# Patient Record
Sex: Female | Born: 1935 | Race: Black or African American | Hispanic: No | Smoking: Never smoker
Health system: Southern US, Community
[De-identification: ages and names within clinical notes are randomized; demographics above are authoritative.]

## PROBLEM LIST (undated history)

## (undated) ENCOUNTER — Emergency Department (HOSPITAL_COMMUNITY): Disposition: A | Discharge status: Home / Self Care | Payer: Medicare Other

## (undated) DIAGNOSIS — T4145XA Adverse effect of unspecified anesthetic, initial encounter: Secondary | ICD-10-CM

## (undated) DIAGNOSIS — T8859XA Other complications of anesthesia, initial encounter: Secondary | ICD-10-CM

## (undated) DIAGNOSIS — E119 Type 2 diabetes mellitus without complications: Secondary | ICD-10-CM

## (undated) DIAGNOSIS — I509 Heart failure, unspecified: Secondary | ICD-10-CM

## (undated) DIAGNOSIS — D649 Anemia, unspecified: Secondary | ICD-10-CM

## (undated) DIAGNOSIS — I499 Cardiac arrhythmia, unspecified: Secondary | ICD-10-CM

## (undated) DIAGNOSIS — I2510 Atherosclerotic heart disease of native coronary artery without angina pectoris: Secondary | ICD-10-CM

## (undated) DIAGNOSIS — E11621 Type 2 diabetes mellitus with foot ulcer: Secondary | ICD-10-CM

## (undated) DIAGNOSIS — I739 Peripheral vascular disease, unspecified: Secondary | ICD-10-CM

## (undated) DIAGNOSIS — M069 Rheumatoid arthritis, unspecified: Secondary | ICD-10-CM

## (undated) DIAGNOSIS — L97509 Non-pressure chronic ulcer of other part of unspecified foot with unspecified severity: Secondary | ICD-10-CM

## (undated) DIAGNOSIS — I10 Essential (primary) hypertension: Secondary | ICD-10-CM

## (undated) DIAGNOSIS — J189 Pneumonia, unspecified organism: Secondary | ICD-10-CM

## (undated) DIAGNOSIS — I639 Cerebral infarction, unspecified: Secondary | ICD-10-CM

## (undated) DIAGNOSIS — I219 Acute myocardial infarction, unspecified: Secondary | ICD-10-CM

## (undated) DIAGNOSIS — J45909 Unspecified asthma, uncomplicated: Secondary | ICD-10-CM

## (undated) HISTORY — PX: CHOLECYSTECTOMY OPEN: SUR202

## (undated) HISTORY — DX: Atherosclerotic heart disease of native coronary artery without angina pectoris: I25.10

## (undated) HISTORY — PX: CATARACT EXTRACTION: SUR2

## (undated) HISTORY — DX: Anemia, unspecified: D64.9

## (undated) HISTORY — PX: ABDOMINAL HYSTERECTOMY: SHX81

## (undated) HISTORY — DX: Rheumatoid arthritis, unspecified: M06.9

## (undated) HISTORY — DX: Cardiac arrhythmia, unspecified: I49.9

## (undated) HISTORY — DX: Essential (primary) hypertension: I10

## (undated) HISTORY — PX: APPENDECTOMY: SHX54

## (undated) HISTORY — DX: Acute myocardial infarction, unspecified: I21.9

## (undated) HISTORY — DX: Peripheral vascular disease, unspecified: I73.9

## (undated) HISTORY — DX: Cerebral infarction, unspecified: I63.9

## (undated) HISTORY — PX: TUBAL LIGATION: SHX77

---

## 2006-11-14 DIAGNOSIS — I639 Cerebral infarction, unspecified: Secondary | ICD-10-CM

## 2006-11-14 HISTORY — DX: Cerebral infarction, unspecified: I63.9

## 2007-11-29 ENCOUNTER — Ambulatory Visit: Payer: Self-pay | Admitting: *Deleted

## 2007-12-12 ENCOUNTER — Ambulatory Visit (HOSPITAL_COMMUNITY): Admission: RE | Admit: 2007-12-12 | Discharge: 2007-12-12 | Payer: Self-pay | Admitting: *Deleted

## 2007-12-12 ENCOUNTER — Ambulatory Visit: Payer: Self-pay | Admitting: *Deleted

## 2007-12-13 ENCOUNTER — Ambulatory Visit: Payer: Self-pay | Admitting: *Deleted

## 2007-12-26 ENCOUNTER — Inpatient Hospital Stay (HOSPITAL_COMMUNITY): Admission: RE | Admit: 2007-12-26 | Discharge: 2008-01-02 | Payer: Self-pay | Admitting: *Deleted

## 2007-12-27 ENCOUNTER — Encounter (INDEPENDENT_AMBULATORY_CARE_PROVIDER_SITE_OTHER): Payer: Self-pay | Admitting: *Deleted

## 2007-12-27 HISTORY — PX: PR VEIN BYPASS GRAFT,AORTO-FEM-POP: 35551

## 2008-01-01 ENCOUNTER — Ambulatory Visit: Payer: Self-pay | Admitting: Physical Medicine & Rehabilitation

## 2008-01-09 ENCOUNTER — Ambulatory Visit: Payer: Self-pay | Admitting: Vascular Surgery

## 2008-01-17 ENCOUNTER — Ambulatory Visit: Payer: Self-pay | Admitting: *Deleted

## 2008-01-28 ENCOUNTER — Encounter (HOSPITAL_BASED_OUTPATIENT_CLINIC_OR_DEPARTMENT_OTHER): Admission: RE | Admit: 2008-01-28 | Discharge: 2008-03-17 | Payer: Self-pay | Admitting: Surgery

## 2008-04-03 ENCOUNTER — Ambulatory Visit: Payer: Self-pay | Admitting: *Deleted

## 2008-07-10 ENCOUNTER — Ambulatory Visit: Payer: Self-pay | Admitting: *Deleted

## 2008-10-02 ENCOUNTER — Ambulatory Visit: Payer: Self-pay | Admitting: *Deleted

## 2009-01-01 ENCOUNTER — Ambulatory Visit: Payer: Self-pay | Admitting: *Deleted

## 2009-07-23 ENCOUNTER — Ambulatory Visit: Payer: Self-pay | Admitting: Vascular Surgery

## 2009-09-17 ENCOUNTER — Ambulatory Visit: Payer: Self-pay | Admitting: Vascular Surgery

## 2010-01-07 ENCOUNTER — Ambulatory Visit: Payer: Self-pay | Admitting: Vascular Surgery

## 2010-07-16 ENCOUNTER — Ambulatory Visit: Payer: Self-pay | Admitting: Vascular Surgery

## 2010-12-22 ENCOUNTER — Ambulatory Visit: Payer: Self-pay | Admitting: Vascular Surgery

## 2011-01-26 ENCOUNTER — Ambulatory Visit (INDEPENDENT_AMBULATORY_CARE_PROVIDER_SITE_OTHER): Payer: Medicare Other | Admitting: Vascular Surgery

## 2011-01-26 ENCOUNTER — Encounter (INDEPENDENT_AMBULATORY_CARE_PROVIDER_SITE_OTHER): Payer: Medicare Other

## 2011-01-26 DIAGNOSIS — I70219 Atherosclerosis of native arteries of extremities with intermittent claudication, unspecified extremity: Secondary | ICD-10-CM

## 2011-01-26 DIAGNOSIS — Z48812 Encounter for surgical aftercare following surgery on the circulatory system: Secondary | ICD-10-CM

## 2011-01-27 NOTE — Assessment & Plan Note (Signed)
OFFICE VISIT  [place], Damara DOB:  31-Jul-1928                                       01/26/2011 ZOXWR#:[id number]  I saw patient in the office today for continued followup of her peripheral vascular disease.  This is a pleasant 77 year old woman who had undergone a right femoral to anterior tibial artery bypass graft with a vein graft by Dr. Madilyn Fireman on 12/27/07.  She comes in for a routine follow-up visit.  She was last seen in our office in November 2010 by myself.  She denies any history of claudication, although her activity is fairly limited.  She has had no history of rest pain or nonhealing ulcers.  She does describe some cramping pain in her feet at night.  She has had some swelling in the right leg, which she has had since her surgery, and this has been stable.  This is aggravated by dependency and relieved somewhat with elevation.  SOCIAL HISTORY:  She is widowed.  She has 6 children.  She does not use tobacco.  REVIEW OF SYSTEMS:  CARDIOVASCULAR:  She has occasional chest pressure which has been stable.  She admits to dyspnea on exertion and has occasional palpitations. MUSCULOSKELETAL:  She does have a history of arthritis and joint pain.  PHYSICAL EXAMINATION:  This is a pleasant 82-year woman who appears her stated age.  Blood pressure is 123/73, heart rate is 96, saturation 97%. Lungs are clear bilaterally to auscultation without rales, rhonchi, or wheezing.  On cardiovascular exam, I do not detect any carotid bruits. She has a regular rate and rhythm.  She has palpable femoral pulses and a palpable right popliteal pulse and right dorsalis pedis pulse.  She also has a palpable left dorsalis pedis pulse, although slightly diminished.  Abdomen is soft and nontender with normal-pitched bowel sounds.  Musculoskeletal:  There are no major deformities or cyanosis. Neurologic:  She has no focal weakness or paresthesias.  I have independently  interpreted her arterial Doppler study, which shows an ABI at 94% on the right and 85% on the left.  These are stable.  She has a triphasic peroneal signal and a triphasic dorsalis pedis signal on the right with a monophasic posterior tibial signal.  On the left side, she has a biphasic peroneal signal, a triphasic dorsalis pedis signal, and a monophasic posterior tibial signal.  I reassured her that her bypass graft on the right is patent.  She has a palpable graft pulse and a palpable dorsalis pedis pulse.  I have ordered a follow-up study in 1 year.  I will plan on seeing her back at that time.  She knows to call sooner if she has problems.    Di Kindle. Edilia Bo, M.D. Electronically Signed  CSD/MEDQ  D:  01/26/2011  T:  01/27/2011  Job:  [id number]

## 2011-03-29 NOTE — Assessment & Plan Note (Signed)
OFFICE VISIT   [place], Brenisha  DOB:  01/09/1928                                       01/09/2008  EAVWU#:[id number]   SUMMARY:  The patient is status post right femoral to anterior tibial  bypass by Dr. Madilyn Fireman on February 12.  She presented to the office today  for removal of staples.  I was asked to evaluate her leg due to  swelling.  She apparently had significant swelling in her right lower  extremity just after operation but this has improved significantly.  On  exam today she still has a moderate amount of edema in the right calf.  The right foot also has some swelling around the ankle.  The daughter  and the patient both say that this has been resolving.  There is no  erythema.  There is no drainage from the incisions.  Several of the  staples were removed from the right groin and the right thigh today.  The staples in the calf were left in place due to the increased  swelling.  She will follow up next week to have the remainder of these  removed.  She had a 2+ dorsalis pedis pulse in her right foot.  Overall  she looks good.   Janetta Hora. Fields, MD  Electronically Signed   CEF/MEDQ  D:  01/09/2008  T:  01/11/2008  Job:  [id number]

## 2011-03-29 NOTE — Procedures (Signed)
BYPASS GRAFT EVALUATION   INDICATION:  Follow-up right lower extremity bypass graft; intermittent  numbness in right lower extremity.   HISTORY:  Diabetes:  Yes.  Cardiac:  No.  Hypertension:  Yes.  Smoking:  No.  Previous Surgery:  Right femoral-anterior tibial artery bypass graft  12/2007.   SINGLE LEVEL ARTERIAL EXAM                               RIGHT              LEFT  Brachial:                    168                160  Anterior tibial:             160                140  Posterior tibial:  Peroneal:                    145                142  Ankle/brachial index:        0.95               0.85   PREVIOUS ABI:  Date: 10/02/2008  RIGHT:  1.04  LEFT:  0.81   LOWER EXTREMITY BYPASS GRAFT DUPLEX EXAM:   DUPLEX:  1. Patent femoral-anterior tibial artery bypass graft with biphasic      waveforms proximal to, within and distal to it.  2. Slightly elevated velocities of 236 cm/s noted at the distal      anastomosis.   IMPRESSION:  1. Fairly stable ABIs bilaterally.  2. Patent right femoral-anterior tibial artery bypass graft with      slightly elevated velocities at the distal anastomosis.   ___________________________________________  P. Liliane Bade, M.D.   AS/MEDQ  D:  01/01/2009  T:  01/01/2009  Job:  [id number]

## 2011-03-29 NOTE — Procedures (Signed)
BYPASS GRAFT EVALUATION   INDICATION:  Follow-up evaluation of right lower extremity bypass graft.   HISTORY:  Diabetes:  Yes.  Cardiac:  No.  Hypertension:  Yes.  Smoking:  No.  Previous Surgery:  Right fem to anterior tibial artery bypass graft in  12/2007.   SINGLE LEVEL ARTERIAL EXAM                               RIGHT              LEFT  Brachial:                    170                170  Anterior tibial:             176                138  Posterior tibial:  Peroneal:                    158                100  Ankle/brachial index:        >1.0               0.81   PREVIOUS ABI:  Date: 07/10/08  RIGHT:  >1.0  LEFT:  0.94   LOWER EXTREMITY BYPASS GRAFT DUPLEX EXAM:   DUPLEX:  Doppler arterial waveforms are biphasic proximal to, within,  and distal to the bypass graft.   IMPRESSION:  1. Ankle brachial indices are stable from previous study.  2. Patent right femoral to anterior tibial artery bypass graft.   ___________________________________________  P. Bud Face, M.D.   MC/MEDQ  D:  10/02/2008  T:  10/02/2008  Job:  [id number]

## 2011-03-29 NOTE — Assessment & Plan Note (Signed)
OFFICE VISIT   [place], Tanishi  DOB:  [birth year]                                       09/17/2009  ZOXWR#:[id number]   I saw the patient in the office today for continued followup of her  peripheral vascular disease.  This is a patient who had been followed by  Dr. Madilyn Fireman.  She had poorly healing venous stasis ulcer of the right leg  and underwent a right femoral to below knee pop bypass with a vein  graft, this extended slightly on to the anterior tibial artery.  This  was in February of 2009.  On a recent followup study in September of  this year it was difficult to follow the graft behind the knee although  it appeared to be patent above and below this level.  She had an ABI of  100%.  She comes in for a routine followup visit.  She denies any  history of claudication, rest pain or nonhealing ulcers.  However, her  activities are fairly limited because of her age.   She does have a history of type 2 diabetes which is stable on her  current medications.  In addition, she has hypertension which is stable  on her current medications.   PAST MEDICAL HISTORY:  Is otherwise significant for arthritis.  She  denies any history of hypercholesterolemia, history of previous  myocardial infarction, history of congestive heart failure or history of  COPD.   PAST SURGICAL HISTORY:  She has had previous appendectomy.   FAMILY HISTORY:  She had a sister who had breast cancer.  She is unaware  of any history of premature cardiovascular disease.   SOCIAL HISTORY:  She is married.  She has six children.  She does not  use tobacco.  She does not use alcohol on a regular basis.   REVIEW OF SYSTEMS:  She does have a history of arthritis.  Otherwise  general, cardiac, pulmonary, GI, GU, vascular, neurologic, psychiatric,  ENT, hematologic and integument review of systems is unremarkable and is  documented on the medical history form in her chart.   PHYSICAL EXAMINATION:   General:  This is a pleasant 77 year old woman  who appears her stated age.  Vital signs:  Blood pressure is 135/78,  temperature is 98.0, heart rate is 101.  HEENT:  Pupils are equal, round  and reactive to light and accommodation.  Extraocular motions are  intact.  Conjunctivae are normal.  Neck:  Supple.  There is no JVD.  There is no cervical lymphadenopathy.  Lungs:  Clear bilaterally to  auscultation without rales, rhonchi or wheezing.  Cardiovascular:  I do  not appreciate any carotid bruits.  She has a regular rate and rhythm  without murmur or gallop appreciated.  Vascular:  She has mild edema of  the right leg where she has had vein harvested.  She has palpable radial  femoral pulses bilaterally.  She has a palpable right popliteal and  right dorsalis pedis pulse.  I cannot palpate pulses in the left foot  although the left foot is warm and well-perfused.  Abdomen:  Soft,  nontender with normal pitched bowel sounds.  No masses are appreciated.  Musculoskeletal:  There are no major deformities or cyanosis.  Neurologic:  Is nonfocal with no weakness or paresthesias noted.  Skin:  There are no ulcers or  rashes.   I did independently interpret her Doppler study which shows monophasic  Doppler signals in both feet but an ABI of 100% on the right and 91% on  the left.  Her bypass graft appears to be patent except for one area  which cannot be well visualized.  However, she has good velocities  throughout with no areas of significantly increased velocity except for  some mildly elevated velocities at the distal anastomosis.   I think her graft is patent based on her Doppler study.  I have ordered  a followup study in 3 months and I will see her back at that time.  I  have encouraged her to stay as active as possible.  Fortunately she is  not a smoker.  I certainly would not recommend any aggressive workup to  work up her graft given that she is asymptomatic and has a palpable   dorsalis pedis pulse.  I will see her back in 3 months.   IDENTIFICATION:   [name] [name]. [name] [name], M.D.  Electronically Signed   CSD/MEDQ  D:  09/17/2009  T:  09/18/2009  Job:  [id number]

## 2011-03-29 NOTE — Procedures (Signed)
BYPASS GRAFT EVALUATION   INDICATION:  Follow up right femoral-to-anterior tibial bypass graft.   HISTORY:  Diabetes:  Yes.  Cardiac:  No.  Hypertension:  Yes.  Smoking:  No.  Previous Surgery:  Right femoral to anterior tibial bypass graft in  February, 2009.   SINGLE LEVEL ARTERIAL EXAM                               RIGHT              LEFT  Brachial:                    153                134  Anterior tibial:             148                111  Posterior tibial:            128                106  Peroneal:  Ankle/brachial index:        0.97               0.73   PREVIOUS ABI:  Date: 07/24/09  RIGHT:  1.03  LEFT:  0.91   LOWER EXTREMITY BYPASS GRAFT DUPLEX EXAM:   DUPLEX:  Biphasic waveforms noted proximally within and distally  throughout the right femoral to anterior tibial bypass graft.  Unable to  duplicate elevated velocities from the previous study.   IMPRESSION:  1. Stable right ankle brachial index.  2. Decreased left ankle brachial index.  There is evidence to suggest      moderate arterial disease.  3. Patent right femoral to anterior tibial bypass graft.         ___________________________________________  Di Kindle. Edilia Bo, M.D.   CB/MEDQ  D:  01/07/2010  T:  01/07/2010  Job:  [id number]

## 2011-03-29 NOTE — Assessment & Plan Note (Signed)
[facility]   NAME:  [name], [name]               ACCOUNT NO.:  [id number]   MEDICAL RECORD NO.:  [id number]      DATE OF BIRTH:  22-Dec-1927   PHYSICIAN:  Theresia Majors. Tanda Rockers, M.D. VISIT DATE:  02/14/2008                                   OFFICE VISIT   SUBJECTIVE:  [name] is a 77 year old lady who we are seeing for  management of a stasis ulcer.  In the interim, we have treated her with  triamcinolone ointment and a Profore light.  She continues to be  minimally ambulatory.  She is living with her daughter.  There has been  no fever or excessive swelling.  There has been no drainage.   OBJECTIVE:  VITAL SIGNS:  Blood pressure is 161/79, respirations 16,  pulse rate 90, temperature 98, capillary blood glucose is 44.  Inspection of the right lower extremity shows a persistence of 3+ edema  at the pedal level extending to the distal third of the leg.  There is  hyperpigmentation.  There is a palpable pulse; however, the ulcers on  the right lateral and the right posterior area have a thin well-adherent  yellowish exudate.  The wounds are nontender.  There is minimum  erythema.  No evidence of abscess tracking.   ASSESSMENT:  Clinical response to Profore Light.   PLAN:  We will return the patient to compression using the Profore Light  and triamcinolone ointment.  We have given her a prescription for  bilateral 20-30 mm compression hose with open toes.  She will procure  these garments and will be reevaluated in 1 week.  We anticipate that at  that time she will be able to make a transition into the below-the-knee  garment.      Harold A. Tanda Rockers, M.D.  Electronically Signed     HAN/MEDQ  D:  02/14/2008  T:  02/14/2008  Job:  [id number]

## 2011-03-29 NOTE — Procedures (Signed)
VASCULAR LAB EXAM   INDICATION:  Right greater saphenous vein mapping prior to right fem-tib  bypass graft.  History of bilateral claudication.   HISTORY:  Diabetes:  Yes.  Cardiac:  No.  Hypertension:  Yes.   EXAM:  Reverse greater saphenous vein mapping.   IMPRESSION:  There are two major branches of the right greater saphenous  vein.  The more posterior and medial branch is the more substantial of  the two with a diameter ranging from 0.3 to 0.6 cm from the groin to the  ankle.   ___________________________________________  P. Liliane Bade, M.D.   MC/MEDQ  D:  12/13/2007  T:  12/14/2007  Job:  [id number]

## 2011-03-29 NOTE — Procedures (Signed)
BYPASS GRAFT EVALUATION   INDICATION:  Status post right fem to anterior tibial artery bypass  graft.   HISTORY:  Diabetes:  Yes.  Cardiac:  No.  Hypertension:  Yes.  Smoking:  No.  Previous Surgery:  Right fem to anterior tibial bypass graft in February  of 2009 by Dr. Madilyn Fireman.   SINGLE LEVEL ARTERIAL EXAM                               RIGHT              LEFT  Brachial:                    170                169  Anterior tibial:             171                145  Posterior tibial:            150                96  Peroneal:  Ankle/brachial index:        1.0                0.85   PREVIOUS ABI:  Date:  RIGHT:  LEFT:   LOWER EXTREMITY BYPASS GRAFT DUPLEX EXAM:   DUPLEX:  Patent right lower extremity bypass graft with no increase in  Doppler velocities noted.   IMPRESSION:  1. Patent right fem to anterior tibial artery bypass graft noted.  2. Normal right ankle brachial index with a mildly decreased left ABI      noted.   ___________________________________________  P. Liliane Bade, M.D.   CH/MEDQ  D:  04/03/2008  T:  04/03/2008  Job:  [id number]

## 2011-03-29 NOTE — Consult Note (Signed)
VASCULAR SURGERY CONSULTATION   [place], Asheton  DOB:  01/11/1936                                       11/29/2007  DGUYQ#:[id number]   CHIEF COMPLAINT:  Right leg pain with nonhealing ulceration.   HISTORY OF PRESENT ILLNESS:  The patient is a 77 year old female with a  history of diabetes and hypertension who presents at this time with a 4-  6 week history of right leg pain.  This has been associated with an  ulcer on the right lateral aspect of her calf for approximately 3 weeks.  She feels that her feet are cold.  She has numbness in her right foot.  She notes increased swelling of her right leg.  She has pain in her  right leg at night which awakens her.  She is able to get up and walk or  put her foot over the side of the bed for relief of pain.  The pain is  brought on by walking with some right calf claudication type symptoms,  relieved by rest.   PAST MEDICAL HISTORY:  1. Type 2 diabetes.  2. Hypertension.  3. Rheumatoid arthritis.   FAMILY HISTORY:  Mother died in her 84's with the complications of  childbirth.  Father died in his 78's with a history of diabetes and  heart disease.  Two siblings are deceased, a brother at 57 and a sister  in her 43's, both of cancer.   SOCIAL HISTORY:  The patient is widowed with 6 children.  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  She does not smoke or use alcohol products.  Never used tobacco products significantly.   REVIEW OF SYSTEMS:  This was reviewed today per the patient encounter  form.  The patient notes only pain in her legs, a nonhealing ulcer,  arthritic joint discomfort and muscle pain.  Denies general, cardiac,  pulmonary, GI, GU, neurologic, psychiatric, ENT or hematologic problems.   MEDICATIONS:  1. Glipizide 10 mg daily.  2. Darvocet 1-2 tablets q.4 h p.r.n.  3. Amoxicillin 500 mg daily.  4. Lasix 40 mg daily.  5. Lanoxin 0.25 mg daily.  6. Enalapril 5 mg daily.   ALLERGIES:  PENICILLIN  causes a rash and MYCIN antibiotics also cause a  rash.   PHYSICAL EXAMINATION:  General:  A 77 year old [place] female.  She is alert and oriented.  In no acute distress.  Vital signs:  BP  139/76, pulse 110 per minute, respirations 18 per minute, O2 sat 99%.  HEENT:  Mouth and throat are clear.  Normocephalic.  Atraumatic.  Extraocular movements intact.  Neck:  Supple.  No thyromegaly or  adenopathy.  Cardiovascular:  Normal at bedtime without murmurs.  Regular rate and rhythm.  No rubs or gallops.  Respiratory:  Equal entry  bilaterally.  No rales or rhonchi.  Abdomen:  Soft and nontender.  No  masses or organomegaly.  Normal bowel sounds without bruits.  Extremities:  Lower extremity reveal intact femoral pulses bilaterally.  No popliteal, posterior tibial or dorsalis pedis pulses are palpable.  The right leg reveals mild chronic venous insufficiency changes with 2+  edema, superficial varicosities and mild pigmentation.  There is a 2 cm  diameter lateral malleolar ulcer just superior to the malleolus in the  right leg.  Neurological:  The patient is alert and  oriented.  Gait is  normal.  Cranial nerves intact.  Strength equal bilaterally.  2+  reflexes.  Skin:  Warm and dry.  Chronic skin changes noted in the right  leg with ulceration as noted above.   INVESTIGATIONS:  MR angiogram carried out in [place] reveals  occlusion of the mid right superficial femoral artery with  reconstitution of the popliteal artery and 1 vessel runoff to the ankle.  Ankle brachial indices are 0.20 on the right and 0.86 on the left.  Digital pressures were measured as zero on the right and 0.47 on the  left.   IMPRESSION:  1. Right lower extremity peripheral vascular disease with ulceration.  2. Chronic venous insufficiency right lower extremity with ulceration.  3. Type 2 diabetes.  4. Hypertension.  5. Rheumatoid arthritis.   MEDICAL DECISION MAKING:  The patient has a nonhealing  ulcer on her  right lower extremity related to peripheral vascular disease and chronic  venous insufficiency.  Healing of this will be dependent on first  establishing adequate inflow into the right lower leg.  This hopefully  can be carried out by percutaneous technique.  The patient is scheduled  to undergo lower extremity arteriogram with possible intervention  12/12/2007 at [facility].  She will begin aspirin 325 mg daily  at this time and Plavix 75 mg daily 4 days prior to the procedure.   Other chronic medical conditions including rheumatoid arthritis, type 2  diabetes and hypertension appear well controlled at this time.   Balinda Quails, M.D.  Electronically Signed  PGH/MEDQ  D:  11/29/2007  T:  11/30/2007  Job:  [id number]   cc:   [place]

## 2011-03-29 NOTE — Assessment & Plan Note (Signed)
OFFICE VISIT   [place], Chrissie  DOB:  [birth year]                                       12/13/2007  ZOXWR#:[id number]   The patient is seen in the office today with her daughters to discuss  the results of her arteriogram yesterday.  This reveals extensive  peripheral vascular disease of the right lower extremity.  The right  superficial femoral artery was totally occluded.  There was also severe  disease of the right popliteal artery with single vessel anterior tibial  runoff.   The patient has a non-healing venous stasis ulcer of her right leg and  right ABI of 0.20.   I feel the only option at this time for healing of this ulcer and  reducing the risk of potential limb loss is right femoral anterior  tibial bypass.   A duplex scan was carried out today.  This reveals a patent, good-sized  right saphenous vein.   Surgery is therefore scheduled for 11/25/2007 at [facility].  We discussed the potential risks of this procedure.  She has no history  of heart disease, although there is a risk of myocardial event.  Also a  risk of stroke.  Also a risk of potential graft failure and potential  limb loss.   Balinda Quails, M.D.  Electronically Signed   PGH/MEDQ  D:  12/13/2007  T:  12/14/2007  Job:  [id number]

## 2011-03-29 NOTE — Assessment & Plan Note (Signed)
OFFICE VISIT   [place], Jewelianna  DOB:  Apr 21, 1928                                       01/17/2008  ZOXWR#:[id number]   The patient underwent right femoral anterior tibial bypass with  saphenous vein graft 12/27/2007 at [facility].  She has been  staying at [facility] for convalescence.  Planned  discharge from that facility this weekend.   No major complaints other than leg swelling.   Incisions are healing well.  2+ dorsalis pedis pulse on the right.  She  still has a right lateral calf venous stasis ulcer.   BP 150/79, pulse 103 per minute.   The patient is doing quite well following her surgery.  I will plan to  follow up with her per routine protocol.  She will be referred back to  Dr. Tanda Rockers at the [facility] for further management  of her leg ulcers.  The leg is not fully revascularized.   Balinda Quails, M.D.  Electronically Signed   PGH/MEDQ  D:  01/17/2008  T:  01/18/2008  Job:  [id number]   cc:   Jake Shark A. Tanda Rockers, M.D.  Sanjuana Letters, MD

## 2011-03-29 NOTE — Assessment & Plan Note (Signed)
[facility]   NAME:  [name] [name], [name] [name]               ACCOUNT NO.:  [id number]   MEDICAL RECORD NO.:  [id number]      DATE OF BIRTH:  09-Jul-1928   PHYSICIAN:  Theresia Majors. Tanda Rockers, M.D. VISIT DATE:  02/07/2008                                   OFFICE VISIT   SUBJECTIVE:  [name] is a 77 year old lady who we are seeing in  followup for a stasis ulcer.  The patient has had revascularization and  was initially evaluated with some postoperative edema, which  contraindicated placement of a standard compression wrap.  Since that  time, she has been treated with topical Iodosorb every 2 to 3 days  without compression.  She continues to keep the leg elevated.  There has  been no interim fever, pain or drainage.  She is accompanied by her  daughter.   OBJECTIVE:  Blood pressure 131/64, respirations 18, pulse rate 96,  temperature is 99.9.  Capillary blood glucose is 164 mg percent.  Inspection of the right lower extremity shows that there is impacted  Iodosorb in the previous stasis ulcers.  The foot is warm.  There is a  palpable dorsalis pedis pulse.  There is a persistence of 2+ edema, but  this is a marked improvement compared to her last exam.  There is no  evidence of active infection.  The surgical incisions are well  approximated.   ASSESSMENT:  Clinical response to revascularization and topical  Iodosorb.   PLAN:  We will apply mild compression in the form of a Profore Lite and  reevaluate the patient in 1 week.      Harold A. Tanda Rockers, M.D.  Electronically Signed     HAN/MEDQ  D:  02/07/2008  T:  02/07/2008  Job:  [id number]

## 2011-03-29 NOTE — Op Note (Signed)
NAME:  [name], [name]               ACCOUNT NO.:  [id number]   MEDICAL RECORD NO.:  [id number]          PATIENT TYPE:  AMB   LOCATION:  SDS                          FACILITY:  [facility]   PHYSICIAN:  Balinda Quails, M.D.    DATE OF BIRTH:  May 04, 1928   DATE OF PROCEDURE:  12/12/2007  DATE OF DISCHARGE:                               OPERATIVE REPORT   PREOPERATIVE DIAGNOSIS:  Nonhealing ulcer, right lower extremity.   POSTOPERATIVE DIAGNOSIS:  Nonhealing ulcer, right lower extremity.   PROCEDURE:  1. Abdominal aortogram.  2. Bilateral lower extremity runoff arteriography.   ACCESS:  Left common femoral artery 5 French sheath.   SELECTIVE CATHETERIZATIONS:  Second order right external iliac artery.   CONTRAST:  110 mL Visipaque.   COMPLICATIONS:  None apparent.   CLINICAL NOTE:  [name] [name] is a 77 year old female with a history of  chronic venous insufficiency.  She now has an ulcer on her right lower  extremity which has not healed with conservative treatment.  Workup for  this revealed marked reduction in right ankle brachial index of 0.20.  She is brought to the cath lab at this time for diagnostic workup with  arteriography and possible intervention.   PROCEDURE NOTE:  The patient was brought to the cath lab in stable  condition.  She was placed in a supine position.  Both groins were  prepped and draped in a sterile fashion.  She was administered 25 mcg  femoral intravenously.  The skin and subcutaneous tissue of the left  groin was instilled with 1% Xylocaine.  An 18 gauge needle was  introduced in the left common femoral artery.  A 0.035 Wholey guidewire  was advanced through the needle into the mid abdominal aorta.  The  needle was removed, a 5 [place] sheath advanced over the guidewire, the  dilator, and the sheath flushed with heparin saline solution.  A  standard pigtail catheter was advanced over the guidewire to the mid  abdominal aorta.  A standard AP mid abdominal  aortogram was obtained.  This revealed single widely patent bilateral renal arteries.  The  superior mesenteric artery filled briskly.  The inferior mesenteric  artery was also patent.  The infrarenal aorta was widely patent without  significant plaque or narrowing.  The common iliac arteries bilaterally  were normal in caliber.  The hypogastric and external iliac arteries  were patent bilaterally.   The pigtail catheter was then brought down to the aortic bifurcation.  The pigtail was hooked on the bifurcation and an angled Glidewire was  then advanced across the bifurcation into the right external iliac  artery.  The pigtail catheter was removed and an endhole catheter was  advanced into the right external iliac artery.  A selective right lower  extremity arteriogram was obtained.  This revealed the right external  iliac artery to be widely patent.  The right common femoral artery was  also widely patent.  The right profunda femoris artery was intact.  The  right superficial femoral artery revealed an occlusion in the mid thigh.  Extensive profunda and superficial  femoral collaterals were then present  which reconstituted a severely diseased popliteal artery at the level of  the knee joint.  A small popliteal artery then gave rise to a patent  right anterior tibial artery which provided flow to the foot.  The right  peroneal and posterior tibial arteries were occluded in the proximal and  mid calf.  There was reconstitution of the right posterior tibial artery  at the level of the malleolus of the right foot via collaterals.   The endhole catheter was then removed.  A retrograde injection was then  made through the left femoral sheath to obtain a left lower extremity  arteriogram.  This revealed the left external and common femoral  arteries to be widely patent.  The left profunda femoris artery was  intact.  The left superficial femoral artery was patent.  There was  disease at the  proximal portion of the left superficial femoral artery  with a tubular stenosis estimated to be 50%.  Several areas of  irregularity were noted throughout the length of the left superficial  femoral artery with areas of approximately 50% stenosis.  There was  continuous flow, however, to the level of the left popliteal artery  which was patent although there was disease noted just proximal to the  knee joint.  The left popliteal artery then gave rise to a large  anterior tibial artery which provided continuous flow to the foot.  At  the mid calf level, there was a moderate to severe stenosis of the left  anterior tibial artery.  The posterior tibial and peroneal arteries in  the left calf were occluded proximally with some reconstitution noted  distally.   This completed the arteriogram procedure.  The left femoral sheath was  removed without apparent complications.  The patient was transferred to  the holding area in stable condition.   FINAL IMPRESSION:  1. Normal renal arteries bilaterally.  2. Intact aortoiliac segment.  3. Right superficial femoral artery occlusion with severely diseased      tibial runoff in the right calf via anterior tibial artery.  4. Left lower extremity reveals patent runoff, although several areas      of stenosis were noted in the superficial femoral, popliteal, and      dominant left anterior tibial runoff vessel.   DISPOSITION:  These results will be reviewed further with the patient  and family, the patient is not a candidate for endovascular repair or  recanalization due to severe disease and it is recommended consideration  be given to right lower extremity revascularization with bypass surgery.      Balinda Quails, M.D.  Electronically Signed     PGH/MEDQ  D:  12/12/2007  T:  12/12/2007  Job:  [id number]   cc:   [place]

## 2011-03-29 NOTE — Procedures (Signed)
BYPASS GRAFT EVALUATION   INDICATION:  Followup right lower extremity bypass graft.   HISTORY:  Diabetes:  Yes.  Cardiac:  No.  Hypertension:  Yes.  Smoking:  No.  Previous Surgery:  Right femoral to anterior tibial artery bypass graft  in 12/2007.   SINGLE LEVEL ARTERIAL EXAM                               RIGHT              LEFT  Brachial:                    156                150  Anterior tibial:             160                146  Posterior tibial:            152                Not audible  Peroneal:                                       136  Ankle/brachial index:        1.02               0.94   PREVIOUS ABI:  Date:  04/03/2008  RIGHT:  1.0  LEFT:  0.85   LOWER EXTREMITY BYPASS GRAFT DUPLEX EXAM:   DUPLEX:  Biphasic Doppler waveforms noted throughout the right lower  extremity bypass graft and its native vessels with no increase in  velocities.   IMPRESSION:  1. Patent right femoral to anterior tibial artery bypass graft with no      evidence of stenosis noted.  2. Stable bilateral ankle brachial indices.   ___________________________________________  P. Liliane Bade, M.D.   CH/MEDQ  D:  07/10/2008  T:  07/10/2008  Job:  [id number]

## 2011-03-29 NOTE — Consult Note (Signed)
NAME:  [name] [name], [name] [name]               ACCOUNT NO.:  [id number]   MEDICAL RECORD NO.:  [id number]          PATIENT TYPE:  REC   LOCATION:  FOOT                         FACILITY:  [facility]   PHYSICIAN:  Theresia Majors. Tanda Rockers, M.D.DATE OF BIRTH:  [birth year]   DATE OF CONSULTATION:  01/29/2008  DATE OF DISCHARGE:                                 CONSULTATION   SUBJECTIVE:  [name] [name] is a 77 year old lady referred by Dr. Liliane Bade  for evaluation and management of an ulceration on the lateral malleolus  of the right lower extremity.   IMPRESSION:  Stasis ulceration.   RECOMMENDATIONS:  We will proceed with topical application of Iodosorb  gel and judicious use of mild to moderate compression in consideration  of the patient's recent revascularization of the right lower extremity.   SUBJECTIVE:  [name] [name] is a 77 year old lady who was evaluated by Dr.  Madilyn Fireman approximately five weeks ago for increasing rest pain in her right  lower extremity.  She subsequently underwent arteriography and  eventually a vein bypass graft to the tibial vessels of the right lower  extremity.  Synchronously with the symptoms of critical arterial  ischemia, the patient had developed an ulceration on the lateral  malleolus.  That wound persists until this time.  Following her surgery,  there has been improvement in the ulceration, but the swelling has been  persistent.  There has been no drainage.  There is been no fever.  The  patient is ambulatory.  She has had the usual postoperative swelling but  that has markedly decreased as of date.   PAST MEDICAL HISTORY:  Remarkable for drug allergies to PENICILLIN and  MYCIN DRUGS, both of which cause hives swelling and respiratory  embarrassment.   CURRENT MEDICATION LIST:  1. Aspirin 325 mg daily.  2. Enalapril 5 mg daily.  3. Glipizide 10 mg t.i.d.  4. Lanoxin 0.25 mg daily.  5. Lasix 40 mg daily.  6. Hydrocodone/APAP 5/500 q.4h. p.r.n. for pain.  7. Ultram 50 mg  one to two every 6 hours p.r.n.   PAST SURGICAL HISTORY:  1. Most recently her right femoral-anterior tibial bypass per Dr.      Madilyn Fireman.  2. She has had laser eye surgery.  3. Appendectomy.  4. Hysterectomy.  5. Complexed drainage of the frontal sinuses requiring cranioplasty in      her late teens and early 40s.   FAMILY HISTORY:  Positive for diabetes and cancer.   SOCIAL HISTORY:  She is married.  She has six adult children, four live  locally, two remotely.  She is a retired Engineer, civil (consulting).   REVIEW OF SYSTEMS:  She has never smoked.  She denies any chronic cough.  Her appetite is good and her weight is stable.  She denies visual  changes, specifically symptoms consistent with TIAs, transient  paralysis.  She denies chest pain or tachycardia.  Bowel and bladder  function are unremarkable.  Her weight has been stable.  She denies  arthralgias, heat or cold intolerance, polydipsia or polyuria.  She has  been a diabetic for several years.  She  is controlled with diet and  avoidance of stress.   PHYSICAL EXAMINATION:  GENERAL APPEARANCE:  She is an alert, oriented  female in no acute distress.  She is accompanied by her daughter.  VITAL SIGNS:  Blood pressure is 146/68, respirations 16, pulse of 90,  temperature 97.7, capillary blood glucose is 160 mg percent.  HEENT:  Exam is remarkable for postsurgical changes.  NECK:  Supple.  Trachea is midline.  Thyroid is nonpalpable.  LUNGS:  Clear.  HEART:  Heart sounds are normal.  ABDOMEN:  Soft without masses.  EXTREMITIES:  Extremity exam is remarkable for the recent surgical  changes in the right lower extremity.  There is a 3+ edema and resolving  postsurgical ecchymosis.  The surgical incisions are well approximated  with no evidence of infection.  There is a readily palpable dorsalis  pedis pulse on the right lower extremity as well as the left.  There is  a flat ulceration over the right lateral malleolus.  There is no  drainage.  There  is no evidence of ascending infection or excessive  tenderness.  The left lower extremity is essentially normal.  There is  mild hypesthesia in the right lower extremity consistent with her  postsurgical state.  In addition, there is loss of protective sensation  over the hallux in the second digit bilaterally.   DISCUSSION:  [name] [name] is five weeks post successful femoral-tibial  bypass with commensurate postsurgical changes.  She continues to be  ambulatory.  She has a persistent stasis ulcer over the right lateral  malleolus.  We will hesitate to add compression therapy for the  management of the stasis ulcer at this time, but we have shown her  daughter the technique of applying topical Iodosorb and a fish net and a  slightly compressive bandage.  We have instructed the patient in  elevation of the legs and the avoidance of dependent edema.  We will  reevaluate her in one week.  If there is any evidence of increase in  this ulceration, we will reconsider the addition of mild to moderate  compression using a Profore Lite.  We have explained the diagnosis and  this treatment plan to the daughter and the patient in terms that they  seem to understand.  They express gratitude for having been seen in the  clinic and indicate that they will be compliant.      Harold A. Tanda Rockers, M.D.  Electronically Signed     HAN/MEDQ  D:  01/29/2008  T:  01/29/2008  Job:  [id number]   cc:   Georgann Housekeeper, MD

## 2011-03-29 NOTE — Op Note (Signed)
NAME:  [name], Dayona               ACCOUNT NO.:  [id number]   MEDICAL RECORD NO.:  [id number]          PATIENT TYPE:  INP   LOCATION:  2007                         FACILITY:  [facility]   PHYSICIAN:  Balinda Quails, M.D.    DATE OF BIRTH:  21-Aug-1928   DATE OF PROCEDURE:  12/27/2007  DATE OF DISCHARGE:                               OPERATIVE REPORT   SURGEON:  Balinda Quails, MD.   ASSISTANT:  Wilmon Arms, PA.   ANESTHETIC:  General endotracheal.   PREOPERATIVE DIAGNOSIS:  Ischemic right lower extremity.   POSTOPERATIVE DIAGNOSIS:  Ischemic right lower extremity.   PROCEDURE:  Right femoral-anterior tibial bypass with reversed  autogenous saphenous vein.   CLINICAL NOTE:  [name] [name] is a 77 year old diabetic female referred  by Dr. Tanda Rockers from the wound care center with a poorly-healing venous  stasis ulcer of the right lower extremity.  Workup did reveal advanced  peripheral vascular disease.  Arteriography revealed extensive disease  of the right superficial femoral artery and proximal popliteal artery.  There was reconstitution of the distal popliteal artery with dominant  anterior tibial runoff.   The patient is brought to the operating at this time for a limb salvage  right lower extremity revascularization.   OPERATIVE PROCEDURE:  The patient brought to the operating room in  stable hemodynamic condition.  Placed under general endotracheal  anesthesia.  The right leg was prepped and draped in a sterile fashion.   Oblique skin incision made in the right groin.  Dissection carried down  through subcutaneous tissue with electrocautery.  Deep dissection  carried down to expose the right saphenofemoral junction.  The saphenous  vein was exposed and tributaries ligated with 3-0 silk and divided.  Distal dissection carried down along the saphenous vein, ligating  tributaries with 3-0 silk.  Separated longitudinal skin incisions made  throughout the course of the right  thigh,  the greater saphenous vein  exposed, isolated, tributaries ligated with 3-0 silk and divided.  The  vein was exposed down to the proximal calf through a longitudinal skin  incision.  Mobilized distally.  The vein appeared to be of good quality  with some mild varicose changes.   Through the right groin incision the right common femoral artery was  exposed down to the inguinal ligament and encircled with a vessel loop.  Distal dissection carried down to the origin of the profunda and  superficial femoral arteries, which were freed and encircled with vessel  loops.   The right popliteal artery was then exposed through the medial approach.  The gastrocnemius fascia taken down.  The distal right popliteal artery  freed.  This was moderately diseased.  The right anterior tibial artery  origin was then dissected out.  This was a soft vessel and good-caliber.  The tibioperoneal trunk was encircled with a vessel loop.   The vein was then harvested, ligating it proximally and distally with 2-  0 silk and divided.  The vein cannulated in the reversed fashion and  dilated with heparin saline solution.  It was fairly uniform in size and  of adequate quality for bypass.   A subsartorial tunnel was then created from the popliteal fossa between  the heads of the gastrocnemius, muscles tunneled to the groin.  The  patient then administered 7000 units of heparin intravenously.   The right common femoral, profunda and superficial femoral arteries were  all controlled with clamps.  A longitudinal arteriotomy made in the  distal right common femoral artery.  The reversed vein was beveled and  anastomosed end-to-side to the right common femoral artery using running  6-0 Prolene suture.  The proximal anastomosis completed, the artery  flushed and excellent flow present through the vein.  The proximal  anastomosis was hemostatic.  The vein then brought through the  subsartorial tunnel down to the  exposed distal popliteal and anterior  tibial artery.  The popliteal artery controlled proximally with a  bulldog clamp.  The tibial vessels controlled with loops.  A  longitudinal arteriotomy made in the distal popliteal artery, extended  out onto the origin of the right anterior tibial artery.  The vein was  measured to appropriate length with the leg straightened, divided and  anastomosed end-to-side to the right popliteal and the anterior tibial  arteries using running 6-0 Prolene suture.  At completion of the distal  anastomosis the vessels all flushed, the vein flushed, clamps were  removed, excellent flow present.   Adequate hemostasis was obtained.  Sponge and instrument counts were  correct.  The vein harvest bed was drained with a 15 round Blake drain,  exited inferiorly through the skin, fixed to the skin with 2-0 silk  suture.   The vein harvest incisions were then closed with running 2-0 Vicryl  suture in a single subcutaneous layer and staples applied to the skin.  The groin incision closed with running 2-0 Vicryl suture in two  subcutaneous layers and staples applied to skin.   The right popliteal fossa closed with interrupted 2-0 Vicryl suture in a  deep fascial layer and running 2-0 Vicryl suture in the subcutaneous  layer.  Staples applied to skin.  Sterile dressings applied.   The patient tolerated the procedure well.  There were no apparent  complications.  The patient was transferred to the recovery room in  stable condition.      Balinda Quails, M.D.  Electronically Signed     PGH/MEDQ  D:  12/27/2007  T:  12/28/2007  Job:  [id number]   cc:   Jake Shark A. Tanda Rockers, M.D.

## 2011-03-29 NOTE — Procedures (Signed)
BYPASS GRAFT EVALUATION   INDICATION:  Postop right fem to anterior tibial artery bypass graft.   HISTORY:  Diabetes:  Yes.  Cardiac:  No.  Hypertension:  Yes.  Smoking:  No.  Previous Surgery:  Yes.   SINGLE LEVEL ARTERIAL EXAM                               RIGHT              LEFT  Brachial:                    122                127  Anterior tibial:             155                97  Posterior tibial:            106                Inaudible  Peroneal:  Ankle/brachial index:        1.22               0.76   PREVIOUS ABI:  Date:  01/07/2010  RIGHT:  0.97  LEFT:  0.73   LOWER EXTREMITY BYPASS GRAFT DUPLEX EXAM:   DUPLEX:  Biphasic waveforms noted proximally within and distally  throughout the right femoral to anterior tibial artery bypass graft.   IMPRESSION:  1. Stable right ankle brachial index.  2. Evidence of moderate left lower extremity arterial disease.  3. Patent right femoral to anterior tibial bypass graft.      ___________________________________________  Di Kindle. Edilia Bo, M.D.   EM/MEDQ  D:  07/16/2010  T:  07/16/2010  Job:  [id number]

## 2011-03-29 NOTE — Assessment & Plan Note (Signed)
[facility]   NAME:  [name] [name], [name] [name]               ACCOUNT NO.:  [id number]   MEDICAL RECORD NO.:  [id number]      DATE OF BIRTH:  06-29-1928   PHYSICIAN:  Theresia Majors. Tanda Rockers, M.D. VISIT DATE:  02/21/2008                                   OFFICE VISIT   SUBJECTIVE:  [name] is a 77 year old female who has undergone  revascularization of her right lower extremity, but had a persistent  stasis ulcer.  We treated her with compression wraps and topical  Iodosorb.  She returns for followup.  There has been no intermittent  drainage, malodor, pain, or fevers.  She continues to be ambulatory.   OBJECTIVE:  Blood pressure is 107/74, respiration is 18, pulse rate 81,  and temperature 97.6.  She is accompanied by her daughter.  Inspection  of the right lower extremity shows that the stasis ulcers numbered 1 and  2 have completely resolved.  There is 100% granulation with no evidence  of infection.  There is a persistence of 1+ edema.  Capillary refill is  brisk.   ASSESSMENT:  Resolved stasis ulcer.   PLAN:  We are discharging the patient from active management in the  Wound Center.  She has procured and has been adequately fitted for below  the knee 30-40 mm open toe compression hose.  She is discharged.  Continue her follow up per her primary care physician.      Harold A. Tanda Rockers, M.D.  Electronically Signed     HAN/MEDQ  D:  02/21/2008  T:  02/22/2008  Job:  [id number]   cc:   Hinton Lovely, M.D.

## 2011-03-29 NOTE — Discharge Summary (Signed)
NAME:  [name] [name], [name] [name]               ACCOUNT NO.:  [id number]   MEDICAL RECORD NO.:  [id number]          PATIENT TYPE:  INP   LOCATION:  2007                         FACILITY:  [facility]   PHYSICIAN:  Balinda Quails, M.D.    DATE OF BIRTH:  June 21, 1928   DATE OF ADMISSION:  12/26/2007  DATE OF DISCHARGE:  01/02/2008                               DISCHARGE SUMMARY   ADMISSION DIAGNOSIS:  Ischemic right lower extremity.   DISCHARGE DIAGNOSES:  Ischemic right lower extremity.   SECONDARY DISCHARGE DIAGNOSES:  1. Type 2 diabetes.  2. Hypertension.  3. Rheumatoid arthritis.  4. Nonhealing ulcers in the right leg.   CONSULTATION:  Pharmacy on December 26, 2007.  Physical therapy on December 28, 2007.  Clinical social worker for short-term nursing facility placement on  January 01, 2008.  Occupational therapy on January 01, 2008.  Rehabilitation on December 31, 2007.   PROCEDURES:  Right femoral anterior tibial bypass with reversed  autogenous saphenous vein.   BRIEF HISTORY AND PHYSICAL:  [name] [name] is a 77 year old diabetic  female, referred by Dr. Tanda Rockers from the wound care center with a poorly-  healing venous stasis ulcer of the right lower extremity.  Workup did  reveal advanced peripheral vascular disease.  Arteriography revealed  extensive disease of the right superficial femoral artery and proximal  popliteal artery.  There was reconstruction of the distal popliteal  artery with anterior tibial run-off.  It was felt it was most  appropriate to have the patient brought to the operating room for limb  salvage, right lower extremity revascularization, on December 27, 2007.  After explaining risks and benefits, patient agreed to this procedure.   HOSPITAL COURSE:  Patient was admitted to [facility] on  December 26, 2007, to have procedure done by Dr. Madilyn Fireman for right femoral  anterior tibial bypass graft.  Patient was explained the risks and  benefits of this  procedure, agreed and signed consent for the procedure.  Patient was taken to the OR on the morning of December 26, 2007, to  undergo this procedure.  The patient tolerated the procedure with no  complications and remained stable throughout, was taken to the PACU,  where she continued to remain stable.  Patient was then later  transferred to the floor, where she continued to remain stable  throughout the night.  On postoperative day #1, patient was afebrile,  vital signs were stable.  Patient's incisions were clean and dry.  The  patient had a right palpable pulse at the dorsalis pedis.  ABIs were  completed and her right was at 0.63 and her left was at 1.02.   On postoperative day #2, patient continued to do well and had been  transferred to another floor.  She had no complaints and she had  ambulated to chair.  Dressings were removed.  Incisions were clean and  dry.  There was no hematoma.  There was some mild bruising.  The foot  was warm and pink and there was a 1+ palpable pulse at the dorsalis  pedis.  Patient was instructed to continue  to ambulate and would consult  PT if needed.  At this time, her Foley was discontinued and she was  instructed to continue to keep the right extremity elevated and  dressings dry in the groin.  At this time, we looked for possible plans  to discharge home on Monday.   Over the weekend, patient continued to remain afebrile and vitals were  stable.  Patient did complain, though, of increased pain in the right  leg.  We felt that maybe this was due to not taking enough pain  medications and encouraged her to take them more frequently.  Patient  had been alert and oriented and eating and also ambulating.  Patient was  very slow in progression with ambulation though.  The right leg  continued to be clean and dry on dressings intact at the groin.  She was  still having some mild edema with bruising of the left leg.  We  encouraged patient to continue to  elevate.  Foot was warm and patient  continued to have palpable pulses over the DP.  Also, there were not any  acute changes to the ulcers of the ankle and the heel on the right  extremity.   It was felt at this time the plan for discharging to home on Monday was  probably going to have to be extended a little bit due to the fact the  patient was progressing slowly with ambulating.   On Monday, postoperative day #5, the patient continued to remain stable  and afebrile.  Patient stated she was not feeling well, no vomiting, a  little bit of nausea, and in pain.  The right leg still was an issue for  patient as far as pain management, but she had not increased her pain  medicine frequency at all.  Patient's right leg on physical examination,  incisions were clean and dry and healing with no signs of infection.  There was probably about 3+ edema in the lower portion of the extremity.  There was a 2+ palpable pulse of the DP.  The right-leg ulcers were  improving after placing Silvadene and 4 X 4's on them.  Patient  continued to slowly progress with ambulation.  Also, Zofran for the  nausea.  If pain continued to increase and could not be managed, would  consider possibly changing oral medications.  The plans were to  discharge either to rehab or short-term nursing facility, depending on  if patient was a candidate for rehab, as patient lives in [place]  with not a lot of help.  Patient continued same without a lot of  progression with ambulation.  Patient was reviewed by rehab and was not  a candidate due to no other medical issues to be managed by a physician  at this time.  It was discussed with the patient and family about  placement and thought it was best that she go to a short-term nursing  facility to get some rehabilitation before going home.  Patient and  family agreed to this plan.  On January 02, 2008, the patient will be  discharged to nursing home facility for  short-term rehabilitation at  [facility].   PHYSICAL EXAMINATION:  Postoperative day #7:  PATIENT'S VITALS:  Blood pressure is 113/67, heart rate 90, respirations  18, temperature 98.2, oxygen 95 on room air.  There were no current  labs.  Upon entering the room, patient stated that she was improving with  ambulation and pain.  On physical examination, patient was alert and  oriented times three, in bed at the time.  The right leg, after removing  every other staple, it seemed to have helped decrease the edema.  The  patient's incisions were healing.  The foot was warm with a 3+ palpable  pulse at the dorsalis pedis.  Foot ulcers were continuing to improve.   ASSESSMENT AND PLAN:  At this time was to continue to increase  ambulation.  Continue dressing changes and care of the right extremity  wound to keep with dry dressings applied.  Also the right foot, continue  to apply Silvadene and 4 X 4's.  Patient will be discharged to short-  term nursing facility today, January 02, 2008.  Patient and family do  agree with this plan.   DISCHARGE MEDICATIONS:  1. Enalapril 5 mg two times daily.  2. Glipizide 10 mg three times daily with meals.  3. Lanoxin 0.25 daily.  4. Furosemide 40 mg daily as needed.  5. Hydrocodone/APAP 5/500 mg every 4-6 hours as needed for pain.  6. Aspirin 325 mg daily.   FOLLOWUP INSTRUCTIONS AND DISCHARGE CARE:  Discussed with patient  discharge instructions.  The patient stated that she understood and  agreed with these instructions.  Patient is planned to be discharged on  January 02, 2008 to short-term nursing facility.  Discussed with  patient she needs to follow a diabetic diet.  Keep wounds in the groin  dry.  Patient is to clean gently with soap and water for wounds.  Patient is to continue right-foot dressings with Silvadene and 4 X 4's.  Patient can increase activity as tolerated, walk with assistance.  Patient may shower.   Patient was instructed no lifting or driving for a  minimum of two weeks.  The patient was also instructed to contact office  or ER if increased redness, drainage, swelling, fever greater than 101,  pain in the lower leg or change in temperature of the extremity.  Patient was also instructed to follow up with Dr. Madilyn Fireman in three weeks  with ABIs and office will contact to schedule.  Patient is to continue  home medications per reconciliation sheet.  Patient also was given a  prescription for Ultram 50 mg one to two tablets every 6 hours as needed  for pain.   Again, patient stated that she agreed and understood these discharge  instructions.  Patient will discharge on January 02, 2008, as patient  is currently stable and afebrile.      Cyndy Freeze, PA      P. Liliane Bade, M.D.  Electronically Signed    ALW/MEDQ  D:  01/02/2008  T:  01/02/2008  Job:  [id number]

## 2011-04-01 NOTE — Procedures (Signed)
BYPASS GRAFT EVALUATION   INDICATION:  Right lower extremity bypass graft.   HISTORY:  Diabetes:  Yes.  Cardiac:  No.  Hypertension:  Yes.  Smoking:  No.  Previous Surgery:  Right femoral to anterior tibial artery bypass graft  on February 2009.   SINGLE LEVEL ARTERIAL EXAM                               RIGHT              LEFT  Brachial:                    172                178  Anterior tibial:             183                162  Posterior tibial:            143                107  Peroneal:  Ankle/brachial index:        1.03               0.91   PREVIOUS ABI:  Date: 01/01/2009  RIGHT:  0.95  LEFT:  0.85   LOWER EXTREMITY BYPASS GRAFT DUPLEX EXAM:   DUPLEX:  Biphasic Doppler waveforms noted in the right proximal to mid  lower extremity bypass graft and the distal native vessels with no flow  being adequately visualized in the distal thigh level bypass graft.  There is a collateral branch noted at the mid-thigh level of the bypass  graft with a velocity of 340 cm/s.   IMPRESSION:  1. Possible occlusion of the distal right femoral to anterior tibial      artery bypass graft with a patent collateral branch noted, as      described above, based on limited visualization due to vessel      depth.  2. Stable bilateral ankle brachial indices noted.   ___________________________________________  Di Kindle. Edilia Bo, M.D.   CH/MEDQ  D:  07/24/2009  T:  07/24/2009  Job:  [id number]

## 2011-08-05 LAB — TYPE AND SCREEN

## 2011-08-05 LAB — BASIC METABOLIC PANEL
BUN: 10
BUN: 7
CO2: 28
Calcium: 9.6
Calcium: 7.9 — ABNORMAL LOW
Creatinine, Ser: 0.61
GFR calc non Af Amer: >60
Glucose, Bld: 131 — ABNORMAL HIGH
Glucose, Bld: 100 — ABNORMAL HIGH
Sodium: 139

## 2011-08-05 LAB — CBC
HCT: 32.7 — ABNORMAL LOW
MCHC: 34.5
Platelets: 301
Platelets: 319
RDW: 15.6 — ABNORMAL HIGH
RDW: 15.8 — ABNORMAL HIGH
RDW: 16 — ABNORMAL HIGH
WBC: 7.3

## 2011-08-05 LAB — COMPREHENSIVE METABOLIC PANEL
AST: 17
Albumin: 3.3 — ABNORMAL LOW
Alkaline Phosphatase: 95
BUN: 11
GFR calc Af Amer: >60
Potassium: 3.9
Sodium: 138
Total Protein: 6.9

## 2011-08-05 LAB — URINALYSIS, ROUTINE W REFLEX MICROSCOPIC
Hgb urine dipstick: NEGATIVE
Nitrite: NEGATIVE
Specific Gravity, Urine: 1.023
Urobilinogen, UA: 1

## 2011-08-05 LAB — PROTIME-INR
INR: 1
INR: 1

## 2011-08-05 LAB — BLOOD GAS, ARTERIAL
O2 Saturation: 97
Patient temperature: 98.6
pH, Arterial: 7.414 — ABNORMAL HIGH

## 2011-08-05 LAB — URINE MICROSCOPIC-ADD ON

## 2011-08-05 LAB — APTT: aPTT: 27

## 2011-08-05 LAB — PREPARE RBC (CROSSMATCH)

## 2012-01-26 ENCOUNTER — Encounter: Payer: Self-pay | Admitting: Vascular Surgery

## 2012-01-31 ENCOUNTER — Encounter: Payer: Self-pay | Admitting: Vascular Surgery

## 2012-02-01 ENCOUNTER — Ambulatory Visit: Payer: Medicare Other | Admitting: Vascular Surgery

## 2012-02-23 ENCOUNTER — Telehealth: Payer: Self-pay | Admitting: Vascular Surgery

## 2012-02-23 NOTE — Telephone Encounter (Signed)
[name] went to the emergency room in [place] on Monday for pain and swelling with a rash in both legs, more in the one on which she had the surgery.  They did a number of tests, including a test to see if she had a blood clot.  They treated the rash and her bladder infection.  She has copies of the tests.  She followed up with her (primary care?) doctor yesterday, who told her to call / be seen ????? In 24 hours.  She called yesterday, as well. Please call her to advise if she should see Dr. Edilia Bo on Wednesday and if she should have any tests before.

## 2012-02-24 ENCOUNTER — Telehealth: Payer: Self-pay

## 2012-02-24 ENCOUNTER — Encounter (HOSPITAL_COMMUNITY): Payer: Self-pay | Admitting: *Deleted

## 2012-02-24 ENCOUNTER — Emergency Department (HOSPITAL_COMMUNITY)
Admission: EM | Admit: 2012-02-24 | Discharge: 2012-02-24 | Disposition: A | Discharge status: Home / Self Care | Payer: Medicare Other | Attending: Emergency Medicine | Admitting: Emergency Medicine

## 2012-02-24 DIAGNOSIS — R21 Rash and other nonspecific skin eruption: Secondary | ICD-10-CM | POA: Insufficient documentation

## 2012-02-24 DIAGNOSIS — M7989 Other specified soft tissue disorders: Secondary | ICD-10-CM | POA: Insufficient documentation

## 2012-02-24 DIAGNOSIS — I10 Essential (primary) hypertension: Secondary | ICD-10-CM | POA: Insufficient documentation

## 2012-02-24 DIAGNOSIS — Z8739 Personal history of other diseases of the musculoskeletal system and connective tissue: Secondary | ICD-10-CM | POA: Insufficient documentation

## 2012-02-24 DIAGNOSIS — E119 Type 2 diabetes mellitus without complications: Secondary | ICD-10-CM | POA: Insufficient documentation

## 2012-02-24 DIAGNOSIS — M79609 Pain in unspecified limb: Secondary | ICD-10-CM | POA: Insufficient documentation

## 2012-02-24 DIAGNOSIS — Z79899 Other long term (current) drug therapy: Secondary | ICD-10-CM | POA: Insufficient documentation

## 2012-02-24 LAB — CBC
HCT: 35.8 % — ABNORMAL LOW (ref 36.0–46.0)
MCH: 25.4 pg — ABNORMAL LOW (ref 26.0–34.0)
MCV: 77.8 fL — ABNORMAL LOW (ref 78.0–100.0)
RBC: 4.6 MIL/uL (ref 3.87–5.11)
RDW: 14.4 % (ref 11.5–15.5)
WBC: 7.6 10*3/uL (ref 4.0–10.5)

## 2012-02-24 LAB — URINE MICROSCOPIC-ADD ON

## 2012-02-24 LAB — BASIC METABOLIC PANEL
CO2: 28 mEq/L (ref 19–32)
Chloride: 99 mEq/L (ref 96–112)
Creatinine, Ser: 0.66 mg/dL (ref 0.50–1.10)
Glucose, Bld: 204 mg/dL — ABNORMAL HIGH (ref 70–99)

## 2012-02-24 LAB — URINALYSIS, ROUTINE W REFLEX MICROSCOPIC
Glucose, UA: 100 mg/dL — AB
Ketones, ur: NEGATIVE mg/dL
Nitrite: NEGATIVE
Specific Gravity, Urine: 1.008 (ref 1.005–1.030)
pH: 6 (ref 5.0–8.0)

## 2012-02-24 LAB — DIFFERENTIAL
Basophils Absolute: 0 10*3/uL (ref 0.0–0.1)
Eosinophils Relative: 3 % (ref 0–5)
Lymphocytes Relative: 30 % (ref 12–46)
Lymphs Abs: 2.3 10*3/uL (ref 0.7–4.0)
Neutro Abs: 4.3 10*3/uL (ref 1.7–7.7)
Neutrophils Relative %: 56 % (ref 43–77)

## 2012-02-24 LAB — POCT I-STAT, CHEM 8
Calcium, Ion: 1.16 mmol/L (ref 1.12–1.32)
Creatinine, Ser: 0.8 mg/dL (ref 0.50–1.10)
Glucose, Bld: 220 mg/dL — ABNORMAL HIGH (ref 70–99)
HCT: 36 % (ref 36.0–46.0)
Sodium: 140 mEq/L (ref 135–145)

## 2012-02-24 LAB — GLUCOSE, CAPILLARY: Glucose-Capillary: 135 mg/dL — ABNORMAL HIGH (ref 70–99)

## 2012-02-24 NOTE — ED Notes (Signed)
Family at bedside. 

## 2012-02-24 NOTE — ED Notes (Signed)
Pt c/o BLE rash, swelling, burning, and itching x3 days, increase swelling and burning to RLE. PCP sent pt here to r/o a possible blood clot. Swelling noted to RLE upon assessment. Palpable pulses and BLE sensation. Pt reports (R) leg pain and numbness.

## 2012-02-24 NOTE — ED Notes (Signed)
Pt now complaining of chest fullness and tightness.

## 2012-02-24 NOTE — Telephone Encounter (Signed)
Returned call to pt. On 02/23/12, at 4:30 pm.  Stated she was told on 4/8,  by ER MD in [place], [place] to see her Vascular doctor within 24 hrs.  Pt. C/o swelling in bilateral ankles to calves, and pain.  States she also has a rash in same area. (States was treated for rash on legs, and a bladder infection from the ER MD).   Questioned about any change in sensation to lower extremities, and stated has "right leg numbness".  States this is new, and started about 3-4 days ago.  Has hx (R) Fem-Pop BP in 12/2007.  States has missed last 2 follow-up appts., due to family situations.  Discussed pt's complaints with Dr. Imogene Burn, MD in office this AM.  Was advised that pt. could possibly be experiencing acute ischemic symptoms, and should go to the [facility] ER today.  Contacted pt. at 9:25 AM and advised her to go to [facility] ER, and to take any paperwork that she was given from work-up at the [facility] ER.  Verb. Understanding.

## 2012-02-24 NOTE — ED Notes (Signed)
Attempted to collect urine sample. Patient unable to urinate at this time.

## 2012-02-24 NOTE — ED Notes (Signed)
Pt reports pain to right lower leg x 1 week, having rash and swelling. Moderate swelling noted at triage. Cms intact.

## 2012-02-24 NOTE — ED Notes (Signed)
Pt denies any questions, verbal understanding of f/u with PCP and vascular.

## 2012-02-24 NOTE — Discharge Instructions (Signed)
See your primary care Dr. for a checkup next week to make sure, that you're continuing to improve. Return here if needed for problems.   Edema Edema is an abnormal build-up of fluids in tissues. Because this is partly dependent on gravity (water flows to the lowest place), it is more common in the leg sand thighs (lower extremities). It is also common in the looser tissues, like around the eyes. Painless swelling of the feet and ankles is common and increases as a person ages. It may affect both legs and may include the calves or even thighs. When squeezed, the fluid may move out of the affected area and may leave a dent for a few moments. CAUSES   Prolonged standing or sitting in one place for extended periods of time. Movement helps pump tissue fluid into the veins, and absence of movement prevents this, resulting in edema.   Varicose veins. The valves in the veins do not work as well as they should. This causes fluid to leak into the tissues.   Fluid and salt overload.   Injury, burn, or surgery to the leg, ankle, or foot, may damage veins and allow fluid to leak out.   Sunburn damages vessels. Leaky vessels allow fluid to go out into the sunburned tissues.   Allergies (from insect bites or stings, medications or chemicals) cause swelling by allowing vessels to become leaky.   Protein in the blood helps keep fluid in your vessels. Low protein, as in malnutrition, allows fluid to leak out.   Hormonal changes, including pregnancy and menstruation, cause fluid retention. This fluid may leak out of vessels and cause edema.   Medications that cause fluid retention. Examples are sex hormones, blood pressure medications, steroid treatment, or anti-depressants.   Some illnesses cause edema, especially heart failure, kidney disease, or liver disease.   Surgery that cuts veins or lymph nodes, such as surgery done for the heart or for breast cancer, may result in edema.  DIAGNOSIS  Your  caregiver is usually easily able to determine what is causing your swelling (edema) by simply asking what is wrong (getting a history) and examining you (doing a physical). Sometimes x-rays, EKG (electrocardiogram or heart tracing), and blood work may be done to evaluate for underlying medical illness. TREATMENT  General treatment includes:  Leg elevation (or elevation of the affected body part).   Restriction of fluid intake.   Prevention of fluid overload.   Compression of the affected body part. Compression with elastic bandages or support stockings squeezes the tissues, preventing fluid from entering and forcing it back into the blood vessels.   Diuretics (also called water pills or fluid pills) pull fluid out of your body in the form of increased urination. These are effective in reducing the swelling, but can have side effects and must be used only under your caregiver's supervision. Diuretics are appropriate only for some types of edema.  The specific treatment can be directed at any underlying causes discovered. Heart, liver, or kidney disease should be treated appropriately. HOME CARE INSTRUCTIONS   Elevate the legs (or affected body part) above the level of the heart, while lying down.   Avoid sitting or standing still for prolonged periods of time.   Avoid putting anything directly under the knees when lying down, and do not wear constricting clothing or garters on the upper legs.   Exercising the legs causes the fluid to work back into the veins and lymphatic channels. This may help the swelling go  down.   The pressure applied by elastic bandages or support stockings can help reduce ankle swelling.   A low-salt diet may help reduce fluid retention and decrease the ankle swelling.   Take any medications exactly as prescribed.  SEEK MEDICAL CARE IF:  Your edema is not responding to recommended treatments. SEEK IMMEDIATE MEDICAL CARE IF:   You develop shortness of breath or  chest pain.   You cannot breathe when you lay down; or if, while lying down, you have to get up and go to the window to get your breath.   You are having increasing swelling without relief from treatment.   You develop a fever over 102 F (38.9 C).   You develop pain or redness in the areas that are swollen.   Tell your caregiver right away if you have gained 3 lb/1.4 kg in 1 day or 5 lb/2.3 kg in a week.  MAKE SURE YOU:   Understand these instructions.   Will watch your condition.   Will get help right away if you are not doing well or get worse.  Document Released: 10/31/2005 Document Revised: 10/20/2011 Document Reviewed: 06/18/2008 [facility] Patient Information 2012 [place].

## 2012-02-24 NOTE — ED Provider Notes (Signed)
History     CSN: [id number]  Arrival date & time 02/24/12  1307   First MD Initiated Contact with Patient 02/24/12 1707      Chief Complaint  Patient presents with  . Leg Pain  . Leg Swelling    (Consider location/radiation/quality/duration/timing/severity/associated sxs/prior treatment) HPI Comments: [name] [name] is a 78 y.o. Female who is here for evaluation of right leg swelling for several days. She was seen at an emergency department remotely on 02/20/12. They told her to contact her vascular doctor, and they suggested that she follow up in the emergency department. She states that the rash on her legs have improved since starting an antibiotic that was given for her urinary tract infection (Macrobid). She denies chest pain, shortness of breath, weakness, dizziness, nausea, vomiting , or fever. On review of records with the patient from the emergency department visit on 02/20/12, it appears that she had a Doppler study to rule out a right leg DVT and was subsequently diagnosed with phlebitis.  Patient is a 78 y.o. female presenting with leg pain. The history is provided by the patient and a relative.  Leg Pain     Past Medical History  Diagnosis Date  . Diabetes mellitus   . Hypertension   . Arthritis   . RA (rheumatoid arthritis)   . Peripheral vascular disease     Past Surgical History  Procedure Date  . Appendectomy   . Tubal ligation   . Pr vein bypass graft,aorto-fem-pop 2009    Right Femoral-tibial BPG by Dr. Madilyn Fireman    Family History  Problem Relation Age of Onset  . Cancer Sister     breast cancer    History  Substance Use Topics  . Smoking status: Never Smoker   . Smokeless tobacco: Not on file  . Alcohol Use: No    OB History    Grav Para Term Preterm Abortions TAB SAB Ect Mult Living                  Review of Systems  All other systems reviewed and are negative.    Allergies  Erythromycin and Penicillins  Home Medications   Current  Outpatient Rx  Name Route Sig Dispense Refill  . ASPIRIN 81 MG PO TABS Oral Take 81 mg by mouth daily.    Marland Kitchen DIGOXIN 0.25 MG PO TABS Oral Take 250 mcg by mouth daily.    . ENALAPRIL MALEATE 5 MG PO TABS Oral Take 5 mg by mouth daily.    . FUROSEMIDE 40 MG PO TABS Oral Take 40 mg by mouth daily as needed. For swelling    . INSULIN ASPART 100 UNIT/ML [place] SOLN Subcutaneous Inject 4-8 Units into the skin 3 (three) times daily before meals. ssi    . INSULIN DETEMIR 100 UNIT/ML [place] SOLN Subcutaneous Inject 20 Units into the skin daily. May take 20 units in the evening if cbg is high      BP 160/64  Pulse 87  Temp(Src) 97.1 F (36.2 C) (Oral)  Resp 14  SpO2 95%  Physical Exam  Nursing note and vitals reviewed. Constitutional: She is oriented to person, place, and time. She appears well-developed and well-nourished.  HENT:  Head: Normocephalic and atraumatic.  Eyes: Conjunctivae and EOM are normal. Pupils are equal, round, and reactive to light.  Neck: Normal range of motion and phonation normal. Neck supple.  Cardiovascular: Normal rate, regular rhythm and intact distal pulses.   Pulmonary/Chest: Effort normal and breath sounds  normal. She exhibits no tenderness.  Abdominal: Soft. She exhibits no distension. There is no tenderness. There is no guarding.  Musculoskeletal: Normal range of motion.       Right leg, calf is swollen and non-tender. Peripheral pulses are intact, and normal.  Neurological: She is alert and oriented to person, place, and time. She has normal strength. She exhibits normal muscle tone.  Skin: Skin is warm and dry. Rash (Very indistinct, reddened area of the bilateral medial calves. No proximal streaking, no skin breakdown, no vessels or petechiae) noted.  Psychiatric: She has a normal mood and affect. Her behavior is normal. Judgment and thought content normal.    ED Course  Procedures (including critical care time)  Labs Reviewed  POCT I-STAT, CHEM 8 - Abnormal;  Notable for the following:    Glucose, Bld 220 (*)    All other components within normal limits  BASIC METABOLIC PANEL - Abnormal; Notable for the following:    Glucose, Bld 204 (*)    GFR calc non Af Amer 82 (*)    All other components within normal limits  CBC - Abnormal; Notable for the following:    Hemoglobin 11.7 (*)    HCT 35.8 (*)    MCV 77.8 (*)    MCH 25.4 (*)    All other components within normal limits  URINALYSIS, ROUTINE W REFLEX MICROSCOPIC - Abnormal; Notable for the following:    Glucose, UA 100 (*)    Leukocytes, UA SMALL (*)    All other components within normal limits  GLUCOSE, CAPILLARY - Abnormal; Notable for the following:    Glucose-Capillary 135 (*)    All other components within normal limits  URINE MICROSCOPIC-ADD ON - Abnormal; Notable for the following:    Squamous Epithelial / LPF FEW (*)    Bacteria, UA MANY (*)    All other components within normal limits  DIFFERENTIAL  URINE CULTURE    Date: 02/24/2012  Rate: 91  Rhythm: normal sinus rhythm  QRS Axis: normal  Intervals: normal  ST/T Wave abnormalities: nonspecific ST/T changes  Conduction Disutrbances:left bundle branch block  Narrative Interpretation:   Old EKG Reviewed: unchanged  Per the note from the vascular Center, she was sent here for evaluation of possible right lower limb ischemia  1. Rash   2. Leg swelling       MDM  Nonspecific rash with right leg swelling, recently had Doppler study that did not indicate a clot. Doubt cellulitis, arterial or vascular clot, metabolic instability.      Plan: Home Medications- usual; Home Treatments- rest; Recommended follow up- PCP f/u in 1 week  Flint Melter, MD 02/24/12 1948

## 2012-02-27 LAB — URINE CULTURE: Culture  Setup Time: 201304122054

## 2012-02-27 NOTE — ED Notes (Signed)
[facility] lab called . Pt urine positive for ESBL

## 2012-03-01 NOTE — ED Notes (Signed)
Prescription [facility] to [facility] at [phone] for bactrim ds po bid x7 days as ordered/requested, no refills.

## 2012-04-17 ENCOUNTER — Encounter: Payer: Self-pay | Admitting: Neurosurgery

## 2012-04-18 ENCOUNTER — Encounter (INDEPENDENT_AMBULATORY_CARE_PROVIDER_SITE_OTHER): Payer: Medicare Other | Admitting: *Deleted

## 2012-04-18 ENCOUNTER — Encounter: Payer: Self-pay | Admitting: Neurosurgery

## 2012-04-18 ENCOUNTER — Ambulatory Visit (INDEPENDENT_AMBULATORY_CARE_PROVIDER_SITE_OTHER): Payer: Medicare Other | Admitting: Neurosurgery

## 2012-04-18 ENCOUNTER — Ambulatory Visit (INDEPENDENT_AMBULATORY_CARE_PROVIDER_SITE_OTHER): Payer: Medicare Other | Admitting: *Deleted

## 2012-04-18 VITALS — BP 129/67 | HR 84 | Resp 16 | Ht 64.0 in | Wt 144.6 lb

## 2012-04-18 DIAGNOSIS — Z48812 Encounter for surgical aftercare following surgery on the circulatory system: Secondary | ICD-10-CM

## 2012-04-18 DIAGNOSIS — I739 Peripheral vascular disease, unspecified: Secondary | ICD-10-CM

## 2012-04-18 NOTE — Progress Notes (Signed)
[facility]  HISTORY AND PHYSICAL   CC: Annual bypass graft evaluation right lower extremity Referring Physician: Edilia [name]  History of Present Illness: 78 year old female patient of Dr. Adele [name] that underwent a right femoral to anterior tibial artery bypass graft with Dr. Madilyn [name] in 2009. The patient denies any signs of claudication, she has no rest pain, she has no open wounds or lower extremities. The patient also denies any new medical diagnoses or recent surgeries.  Past Medical History  Diagnosis Date  . Diabetes mellitus   . Hypertension   . Arthritis   . RA (rheumatoid arthritis)   . Peripheral vascular disease   . Anemia   . Irregular heart beat   . Coronary artery disease     ROS: [x]  Positive   [ ]  Denies    General: [ ]  Weight loss, [ ]  Fever, [ ]  chills Neurologic: [x ] Dizziness, [ ]  Blackouts, [ ]  Seizure [ ]  Stroke, [ ]  "Mini stroke", [ ]  Slurred speech, [ ]  Temporary blindness; [x ] weakness in arms or legs, [ ]  Hoarseness Cardiac: [ ]  Chest pain/pressure, [ ]  Shortness of breath at rest [ ]  Shortness of breath with exertion, [ ]  Atrial fibrillation or irregular heartbeat Vascular: [ ]  Pain in legs with walking, [ ]  Pain in legs at rest, [ ]  Pain in legs at night,  [ ]  Non-healing ulcer, [ ]  Blood clot in vein/DVT,   Pulmonary: [ ]  Home oxygen, [x ] Productive cough, [ ]  Coughing up blood, [x ] Asthma,  [ x] Wheezing Musculoskeletal:  [ ]  Arthritis, [ ]  Low back pain, [ ]  Joint pain Hematologic: [x ] Easy Bruising, [ ]  Anemia; [ ]  Hepatitis Gastrointestinal: [x ] Blood in stool, [ ]  Gastroesophageal Reflux/heartburn, [ ]  Trouble swallowing Urinary: [ ]  chronic Kidney disease, [ ]  on HD - [ ]  MWF or [ ]  TTHS, [x ] Burning with urination, [ ]  Difficulty urinating Skin: [x ] Rashes, [ ]  Wounds Psychological: [ ]  Anxiety, [ ]  Depression   Social History History  Substance Use Topics  . Smoking status: Never Smoker   . Smokeless  tobacco: Not on file  . Alcohol Use: No    Family History Family History  Problem Relation Age of Onset  . Cancer Sister     breast cancer  . Diabetes Sister   . Diabetes Mother   . Diabetes Father   . Cancer Brother   . Diabetes Brother   . Diabetes Daughter   . Hyperlipidemia Daughter   . Deep vein thrombosis Son     Bleeding problems    Allergies  Allergen Reactions  . Erythromycin   . Penicillins     Current Outpatient Prescriptions  Medication Sig Dispense Refill  . aspirin 81 MG tablet Take 81 mg by mouth daily.      . digoxin (LANOXIN) 0.25 MG tablet Take 250 mcg by mouth daily.      . enalapril (VASOTEC) 5 MG tablet Take 5 mg by mouth daily.      . furosemide (LASIX) 40 MG tablet Take 40 mg by mouth daily as needed. For swelling      . insulin aspart (NOVOLOG) 100 UNIT/ML injection Inject 4-8 Units into the skin 3 (three) times daily before meals. ssi      . insulin detemir (LEVEMIR) 100 UNIT/ML injection Inject 20 Units into the skin daily. May take 20 units in the evening if cbg is high  Physical Examination  Filed Vitals:   04/18/12 1451  BP: 129/67  Pulse: 84  Resp: 16    Body mass index is 24.82 kg/(m^2).  General:  WDWN in NAD Gait: Normal HEENT: WNL Eyes: Pupils equal Pulmonary: normal non-labored breathing , without Rales, rhonchi,  wheezing Cardiac: RRR, without  Murmurs, rubs or gallops; No carotid bruits Abdomen: soft, NT, no masses Skin: no rashes, ulcers noted Vascular Exam/Pulses: 2+ radial pulses bilaterally, femoral pulses are palpable but faint, she has posterior tibials bilaterally  Extremities without ischemic changes, no Gangrene , no cellulitis; no open wounds;  Musculoskeletal: no muscle wasting or atrophy  Neurologic: A&O X 3; Appropriate Affect ; SENSATION: normal; MOTOR FUNCTION:  moving all extremities equally. Speech is fluent/normal  Non-Invasive Vascular Imaging: Bypass graft evaluation shows an ABI of 1.03 on  the right, 0.73 on the left which is fairly consistent with last year. The graft appears to be patent.  ASSESSMENT/PLAN: [name]. Patient will followup in one year with repeat lower extremity bypass graft evaluation, her questions were encouraged and answered.  [name] [name] ANP  Clinic M.D.: [name] [name]

## 2012-04-19 NOTE — Progress Notes (Signed)
Addended by: Lorin Mercy K on: 04/19/2012 11:10 AM   Modules accepted: Orders

## 2012-04-25 NOTE — Procedures (Unsigned)
BYPASS GRAFT EVALUATION  INDICATION:  Right lower extremity bypass graft.  HISTORY: Diabetes:  Yes. Cardiac:  No. Hypertension:  Yes. Smoking:  No. Previous Surgery:  Right femoral-to-anterior tibial artery bypass graft in 2009.  SINGLE LEVEL ARTERIAL EXAM                              RIGHT              LEFT Brachial: Anterior tibial: Posterior tibial: Peroneal: Ankle/brachial index:  PREVIOUS ABI:  Date:  RIGHT:  LEFT:  LOWER EXTREMITY BYPASS GRAFT DUPLEX EXAM:  DUPLEX: 1. Biphasic Doppler waveforms noted throughout the right lower     extremity bypass graft with no evidence of internal vessel     narrowing. 2. There is a velocity of 159 cm/s noted in the distal native outflow     vessel which appears to be due to a change in vessel diameter     versus angled vessel takeoff.  IMPRESSION: 1. Patent right lower extremity bypass graft with velocity as     described above. 2. Bilateral ankle brachial indices are noted on a separate report.  ___________________________________________ Di Kindle. Edilia Bo, M.D.  CH/MEDQ  D:  04/19/2012  T:  04/19/2012  Job:  [id number]

## 2013-04-18 ENCOUNTER — Ambulatory Visit: Payer: Medicare Other | Admitting: Neurosurgery

## 2013-04-19 ENCOUNTER — Encounter (INDEPENDENT_AMBULATORY_CARE_PROVIDER_SITE_OTHER): Payer: Medicare Other | Admitting: *Deleted

## 2013-04-19 DIAGNOSIS — I739 Peripheral vascular disease, unspecified: Secondary | ICD-10-CM

## 2013-04-19 DIAGNOSIS — Z48812 Encounter for surgical aftercare following surgery on the circulatory system: Secondary | ICD-10-CM

## 2013-04-19 DIAGNOSIS — I731 Thromboangiitis obliterans [Buerger's disease]: Secondary | ICD-10-CM

## 2013-04-29 ENCOUNTER — Other Ambulatory Visit: Payer: Self-pay | Admitting: *Deleted

## 2013-04-29 DIAGNOSIS — I739 Peripheral vascular disease, unspecified: Secondary | ICD-10-CM

## 2013-04-29 DIAGNOSIS — Z48812 Encounter for surgical aftercare following surgery on the circulatory system: Secondary | ICD-10-CM

## 2013-05-01 ENCOUNTER — Encounter: Payer: Self-pay | Admitting: Surgery

## 2014-05-05 ENCOUNTER — Ambulatory Visit: Payer: Medicare Other | Admitting: Surgery

## 2014-05-06 ENCOUNTER — Encounter: Payer: Self-pay | Admitting: Family

## 2014-05-07 ENCOUNTER — Ambulatory Visit: Payer: Medicare Other | Admitting: Family

## 2014-05-07 ENCOUNTER — Encounter (HOSPITAL_COMMUNITY): Payer: Medicare Other

## 2014-05-07 ENCOUNTER — Other Ambulatory Visit (HOSPITAL_COMMUNITY): Payer: Medicare Other

## 2014-05-08 ENCOUNTER — Ambulatory Visit (INDEPENDENT_AMBULATORY_CARE_PROVIDER_SITE_OTHER)
Admission: RE | Admit: 2014-05-08 | Discharge: 2014-05-08 | Disposition: A | Discharge status: Home / Self Care | Payer: Medicare Other | Source: Ambulatory Visit | Attending: Family | Admitting: Family

## 2014-05-08 ENCOUNTER — Ambulatory Visit (HOSPITAL_COMMUNITY)
Admission: RE | Admit: 2014-05-08 | Discharge: 2014-05-08 | Disposition: A | Discharge status: Home / Self Care | Payer: Medicare Other | Source: Ambulatory Visit | Attending: Family | Admitting: Family

## 2014-05-08 ENCOUNTER — Ambulatory Visit: Payer: Medicare Other | Admitting: Family

## 2014-05-08 DIAGNOSIS — Z48812 Encounter for surgical aftercare following surgery on the circulatory system: Secondary | ICD-10-CM

## 2014-05-08 DIAGNOSIS — I739 Peripheral vascular disease, unspecified: Secondary | ICD-10-CM

## 2014-05-09 NOTE — Patient Instructions (Signed)
Dear [name] [name], your recent vascular lab study of  05-08-14 indicate: NO significant change from your previous study on 04-19-13.  Please follow up with your vascular studies in one year.          Peripheral Vascular Disease  Peripheral vascular disease (PVD) is caused by cholesterol buildup in the arteries. The arteries become narrow or clogged. This makes it hard for blood to flow. It happens most in the legs, but it can occur in other areas of your body. HOME CARE   Quit smoking, if you smoke.  Exercise as told by your doctor.  Follow a low-fat, low-cholesterol diet as told by your doctor.  Control your diabetes, if you have diabetes.  Care for your feet to prevent infection.  Only take medicine as told by your doctor. GET HELP RIGHT AWAY IF:   You have pain or lose feeling (numbness) in your arms or legs.  Your arms or legs turn cold or blue.  You have redness, warmth, and puffiness (swelling) in your arms or legs. MAKE SURE YOU:   Understand these instructions.  Will watch your condition.  Will get help right away if you are not doing well or get worse. Document Released: 01/25/2010 Document Revised: 01/23/2012 Document Reviewed: 01/25/2010 [facility] Patient Information 2015 [place], [place]. This information is not intended to replace advice given to you by your health care provider. Make sure you discuss any questions you have with your health care provider.

## 2014-05-12 NOTE — Progress Notes (Signed)
No show

## 2014-05-20 ENCOUNTER — Encounter: Payer: Self-pay | Admitting: Vascular Surgery

## 2015-05-08 ENCOUNTER — Encounter: Payer: Self-pay | Admitting: Vascular Surgery

## 2015-05-12 ENCOUNTER — Encounter: Payer: Self-pay | Admitting: Family

## 2015-05-13 ENCOUNTER — Ambulatory Visit (INDEPENDENT_AMBULATORY_CARE_PROVIDER_SITE_OTHER): Payer: Medicare Other | Admitting: Family

## 2015-05-13 ENCOUNTER — Ambulatory Visit (INDEPENDENT_AMBULATORY_CARE_PROVIDER_SITE_OTHER)
Admission: RE | Admit: 2015-05-13 | Discharge: 2015-05-13 | Disposition: A | Discharge status: Home / Self Care | Payer: Medicare Other | Source: Ambulatory Visit | Attending: Family | Admitting: Family

## 2015-05-13 ENCOUNTER — Other Ambulatory Visit: Payer: Self-pay | Admitting: Family

## 2015-05-13 ENCOUNTER — Ambulatory Visit (HOSPITAL_COMMUNITY)
Admission: RE | Admit: 2015-05-13 | Discharge: 2015-05-13 | Disposition: A | Discharge status: Home / Self Care | Payer: Medicare Other | Source: Ambulatory Visit | Attending: Family | Admitting: Family

## 2015-05-13 ENCOUNTER — Encounter: Payer: Self-pay | Admitting: Family

## 2015-05-13 VITALS — BP 143/71 | HR 78 | Resp 16 | Ht 63.0 in | Wt 138.0 lb

## 2015-05-13 DIAGNOSIS — Z9889 Other specified postprocedural states: Secondary | ICD-10-CM | POA: Diagnosis not present

## 2015-05-13 DIAGNOSIS — Z48812 Encounter for surgical aftercare following surgery on the circulatory system: Secondary | ICD-10-CM | POA: Insufficient documentation

## 2015-05-13 DIAGNOSIS — I739 Peripheral vascular disease, unspecified: Secondary | ICD-10-CM

## 2015-05-13 DIAGNOSIS — Z95828 Presence of other vascular implants and grafts: Secondary | ICD-10-CM

## 2015-05-13 DIAGNOSIS — I998 Other disorder of circulatory system: Secondary | ICD-10-CM | POA: Diagnosis not present

## 2015-05-13 DIAGNOSIS — I70229 Atherosclerosis of native arteries of extremities with rest pain, unspecified extremity: Secondary | ICD-10-CM

## 2015-05-13 DIAGNOSIS — I779 Disorder of arteries and arterioles, unspecified: Secondary | ICD-10-CM

## 2015-05-13 NOTE — Progress Notes (Signed)
[facility] [place] HISTORY AND PHYSICAL -PAD  History of Present Illness [name] [name] is a 81 y.o. female patient of Dr. Edilia Bo who is s/p right femoral to anterior tibial artery bypass graft with Dr. Madilyn Fireman in 2009. She returns today for routine follow up.  [name] [name] states pt had a TIA about 2008, denies any other TIA's or strokes.  It seems like she does not walk enough to elicit claudication symptoms, denies non healing wounds; she walks with a cane.   The patient denies New Medical or Surgical History.  She had physical therapy for vertigo recently which helped.  Pt Diabetic: Yes, grandson states pt's glucose runs 80-160 Pt smoker: non-smoker  Pt meds include: Statin :No, grandson states her cholesterol is OK Betablocker: No ASA: Yes Other anticoagulants/antiplatelets: no  Past Medical History  Diagnosis Date  . Diabetes mellitus   . Hypertension   . Arthritis   . RA (rheumatoid arthritis)   . Peripheral vascular disease   . Anemia   . Irregular heart beat   . Coronary artery disease     Social History History  Substance Use Topics  . Smoking status: Never Smoker   . Smokeless tobacco: Not on file  . Alcohol Use: No    Family History Family History  Problem Relation Age of Onset  . Cancer Sister     breast cancer  . Diabetes Sister   . Diabetes Mother   . Diabetes Father   . Cancer Brother   . Diabetes Brother   . Diabetes Daughter   . Hyperlipidemia Daughter   . Deep vein thrombosis Son     Bleeding problems    Past Surgical History  Procedure Laterality Date  . Appendectomy    . Tubal ligation    . Cholecystectomy      Gall Bladder  . Pr vein bypass graft,aorto-fem-pop  12/27/2007    Right Femoral-tibial BPG by Dr. Madilyn Fireman    Allergies  Allergen Reactions  . Erythromycin   . Penicillins     Current Outpatient Prescriptions  Medication Sig Dispense Refill  . aspirin 81 MG tablet Take 81 mg by mouth daily.    .  digoxin (LANOXIN) 0.25 MG tablet Take 250 mcg by mouth daily.    . enalapril (VASOTEC) 5 MG tablet Take 5 mg by mouth daily.    . furosemide (LASIX) 40 MG tablet Take 40 mg by mouth daily as needed. For swelling    . insulin aspart (NOVOLOG) 100 UNIT/ML injection Inject 4-8 Units into the skin 3 (three) times daily before meals. ssi    . insulin detemir (LEVEMIR) 100 UNIT/ML injection Inject 20 Units into the skin daily. May take 20 units in the evening if cbg is high     No current facility-administered medications for this visit.    ROS: See HPI for pertinent positives and negatives.   Physical Examination  Filed Vitals:   05/13/15 1422  BP: 143/71  Pulse: 78  Resp: 16  Height: 5\' 3"  (1.6 m)  Weight: 138 lb (62.596 kg)  SpO2: 99%   Body mass index is 24.45 kg/(m^2).  General: A&O x 3, WDWN. Gait: slow and deliberate, using cane Eyes: PERRLA. Pulmonary: CTAB, without wheezes , rales or rhonchi. Cardiac: regular Rythm, without detected murmur.         Carotid Bruits Right Left   Negative Negative  Aorta is not palpable. Radial pulses: are 1+ palpable and =  VASCULAR EXAM: Extremities without ischemic changes, without Gangrene; without open wounds. Peripheral edema: pitting and non pitting: 2+ right ankle/foot, 1+ left ankle/foot                                                                                                          LE Pulses Right Left       FEMORAL   palpable   palpable        POPLITEAL  not palpable   not palpable       POSTERIOR TIBIAL  not palpable   not palpable        DORSALIS PEDIS      ANTERIOR TIBIAL 1+ palpable  not palpable    Abdomen: soft, NT, no palpable masses. Skin: no rashes, no ulcers. Musculoskeletal: no muscle wasting or atrophy.  Neurologic: A&O X 3; Appropriate Affect,; MOTOR FUNCTION:  moving all extremities equally, motor strength 5/5 throughout. Speech is fluent/normal.  CN 2-12  intact.    Non-Invasive Vascular Imaging: DATE: 05/13/2015 LOWER EXTREMITY ARTERIAL DUPLEX EVALUATION    INDICATION:     PREVIOUS INTERVENTION(S):     DUPLEX EXAM:     RIGHT  LEFT   Peak Systolic Velocity (cm/s) Ratio (if abnormal) Waveform  Peak Systolic Velocity (cm/s) Ratio (if abnormal) Waveform  181  B Inflow Artery     152  B Proximal Anastomosis     84  B Proximal Graft     41  B Mid Graft     45  B  Distal Graft     76  B Distal Anastomosis     155  B Outflow Artery     0.83/0.83 Today's ABI / TBI 0.38/30  1.0/0.75 Previous ABI / TBI (05/08/2014 ) 0.70/0.72    Waveform:    M - Monophasic       B - Biphasic       T - Triphasic  If Ankle Brachial Index (ABI) or Toe Brachial Index (TBI) performed, please see complete report  ADDITIONAL FINDINGS:     IMPRESSION: Patent right femoral to anterior tibial artery bypass graft.    Compared to the previous exam:  Decreased bilateral ankle brachial indices since study on 05/08/2014.     ASSESSMENT: [name] [name] is a 81 y.o. female who is s/p right femoral to anterior tibial artery bypass graft in 2009. It seems like she does not walk enough to elicit claudication symptoms, denies non healing wounds; she walks with a cane. Today's right LE arterial Duplex suggests a patent right femoral to anterior tibial artery bypass graft. Decreased bilateral ankle brachial indices since study on 05/08/2014. Right LE ABI decreased from normal to mild arterial occlusive disease in the last year.  Significant decrease in left LE ABI in the last year, from moderate arterial occlusive disease to critical limb ischemia; however, she remains asymptomatic.  Face to face time with patient was 25 minutes. Over 50% of this time was spent on counseling and coordination of care.   PLAN:  Daily seated leg exercises as discussed and  demonstrated to facilitate lower extremity arterial perfusion.   Based on the patient's vascular studies and  examination, pt will return to clinic in 3 months with ABI's and bilateral LE arterial Duplex. She and her grandson know to notify us should she develop non healing wounds or rest pain in her lower legs.   I discussed in depth with the patient the nature of atherosclerosis, and emphasized the importance of maximal medical management including strict control of blood pressure, blood glucose, and lipid levels, obtaining regular exercise, and continued cessation of smoking.  The patient is aware that without maximal medical management the underlying atherosclerotic disease process will progress, limiting the benefit of any interventions.  The patient was given information about PAD including signs, symptoms, treatment, what symptoms should prompt the patient to seek immediate medical care, and risk reduction measures to take.  Charisse March, RN, MSN, FNP-C [facility] Phone: [phone]  Clinic MD: Edilia Bo  05/13/2015 2:19 PM

## 2015-05-13 NOTE — Patient Instructions (Signed)

## 2015-05-14 NOTE — Addendum Note (Signed)
Addended by: Adria Dill L on: 05/14/2015 11:37 AM   Modules accepted: Orders

## 2015-08-25 ENCOUNTER — Encounter: Payer: Self-pay | Admitting: Family

## 2015-08-26 ENCOUNTER — Ambulatory Visit: Payer: Medicare Other | Admitting: Family

## 2015-08-26 ENCOUNTER — Other Ambulatory Visit: Payer: Self-pay | Admitting: Family

## 2015-08-26 ENCOUNTER — Ambulatory Visit (HOSPITAL_COMMUNITY): Payer: Medicare Other

## 2015-08-26 ENCOUNTER — Inpatient Hospital Stay (HOSPITAL_COMMUNITY)
Admission: RE | Admit: 2015-08-26 | Discharge: 2015-08-26 | Disposition: A | Discharge status: Home / Self Care | Payer: Medicare Other | Source: Ambulatory Visit | Attending: Family | Admitting: Family

## 2015-08-26 DIAGNOSIS — Z48812 Encounter for surgical aftercare following surgery on the circulatory system: Secondary | ICD-10-CM

## 2015-08-26 DIAGNOSIS — I779 Disorder of arteries and arterioles, unspecified: Secondary | ICD-10-CM

## 2015-08-26 DIAGNOSIS — I998 Other disorder of circulatory system: Secondary | ICD-10-CM

## 2015-08-26 DIAGNOSIS — I70229 Atherosclerosis of native arteries of extremities with rest pain, unspecified extremity: Secondary | ICD-10-CM

## 2015-10-16 ENCOUNTER — Encounter: Payer: Self-pay | Admitting: Family

## 2015-10-21 ENCOUNTER — Ambulatory Visit (INDEPENDENT_AMBULATORY_CARE_PROVIDER_SITE_OTHER): Payer: Medicare Other | Admitting: Family

## 2015-10-21 ENCOUNTER — Encounter: Payer: Self-pay | Admitting: Family

## 2015-10-21 ENCOUNTER — Ambulatory Visit (HOSPITAL_COMMUNITY)
Admission: RE | Admit: 2015-10-21 | Discharge: 2015-10-21 | Disposition: A | Discharge status: Home / Self Care | Payer: Medicare Other | Source: Ambulatory Visit | Attending: Family | Admitting: Family

## 2015-10-21 ENCOUNTER — Other Ambulatory Visit: Payer: Self-pay

## 2015-10-21 ENCOUNTER — Ambulatory Visit (INDEPENDENT_AMBULATORY_CARE_PROVIDER_SITE_OTHER)
Admission: RE | Admit: 2015-10-21 | Discharge: 2015-10-21 | Disposition: A | Discharge status: Home / Self Care | Payer: Medicare Other | Source: Ambulatory Visit | Attending: Family | Admitting: Family

## 2015-10-21 ENCOUNTER — Ambulatory Visit (HOSPITAL_COMMUNITY): Admission: RE | Admit: 2015-10-21 | Payer: Medicare Other | Source: Ambulatory Visit

## 2015-10-21 ENCOUNTER — Other Ambulatory Visit: Payer: Self-pay | Admitting: Family

## 2015-10-21 VITALS — BP 122/74 | HR 85 | Temp 97.3°F | Ht 63.0 in | Wt 131.4 lb

## 2015-10-21 DIAGNOSIS — Z48812 Encounter for surgical aftercare following surgery on the circulatory system: Secondary | ICD-10-CM

## 2015-10-21 DIAGNOSIS — I779 Disorder of arteries and arterioles, unspecified: Secondary | ICD-10-CM

## 2015-10-21 DIAGNOSIS — I70229 Atherosclerosis of native arteries of extremities with rest pain, unspecified extremity: Secondary | ICD-10-CM

## 2015-10-21 DIAGNOSIS — Z95828 Presence of other vascular implants and grafts: Secondary | ICD-10-CM | POA: Diagnosis not present

## 2015-10-21 DIAGNOSIS — I998 Other disorder of circulatory system: Secondary | ICD-10-CM | POA: Insufficient documentation

## 2015-10-21 NOTE — Patient Instructions (Signed)
Peripheral Vascular Disease Peripheral vascular disease (PVD) is a disease of the blood vessels that are not part of your heart and brain. A simple term for PVD is poor circulation. In most cases, PVD narrows the blood vessels that carry blood from your heart to the rest of your body. This can result in a decreased supply of blood to your arms, legs, and internal organs, like your stomach or kidneys. However, it most often affects a person's lower legs and feet. There are two types of PVD.  Organic PVD. This is the more common type. It is caused by damage to the structure of blood vessels.  Functional PVD. This is caused by conditions that make blood vessels contract and tighten (spasm). Without treatment, PVD tends to get worse over time. PVD can also lead to acute ischemic limb. This is when an arm or limb suddenly has trouble getting enough blood. This is a medical emergency. CAUSES Each type of PVD has many different causes. The most common cause of PVD is buildup of a fatty material (plaque) inside of your arteries (atherosclerosis). Small amounts of plaque can break off from the walls of the blood vessels and become lodged in a smaller artery. This blocks blood flow and can cause acute ischemic limb. Other common causes of PVD include:  Blood clots that form inside of blood vessels.  Injuries to blood vessels.  Diseases that cause inflammation of blood vessels or cause blood vessel spasms.  Health behaviors and health history that increase your risk of developing PVD. RISK FACTORS  You may have a greater risk of PVD if you:  Have a family history of PVD.  Have certain medical conditions, including:  High cholesterol.  Diabetes.  High blood pressure (hypertension).  Coronary heart disease.  Past problems with blood clots.  Past injury, such as burns or a broken bone. These may have damaged blood vessels in your limbs.  Buerger disease. This is caused by inflamed blood  vessels in your hands and feet.  Some forms of arthritis.  Rare birth defects that affect the arteries in your legs.  Use tobacco.  Do not get enough exercise.  Are obese.  Are age 50 or older. SIGNS AND SYMPTOMS  PVD may cause many different symptoms. Your symptoms depend on what part of your body is not getting enough blood. Some common signs and symptoms include:  Cramps in your lower legs. This may be a symptom of poor leg circulation (claudication).  Pain and weakness in your legs while you are physically active that goes away when you rest (intermittent claudication).  Leg pain when at rest.  Leg numbness, tingling, or weakness.  Coldness in a leg or foot, especially when compared with the other leg.  Skin or hair changes. These can include:  Hair loss.  Shiny skin.  Pale or bluish skin.  Thick toenails.  Inability to get or maintain an erection (erectile dysfunction). People with PVD are more prone to developing ulcers and sores on their toes, feet, or legs. These may take longer than normal to heal. DIAGNOSIS Your health care provider may diagnose PVD from your signs and symptoms. The health care provider will also do a physical exam. You may have tests to find out what is causing your PVD and determine its severity. Tests may include:  Blood pressure recordings from your arms and legs and measurements of the strength of your pulses (pulse volume recordings).  Imaging studies using sound waves to take pictures of   the blood flow through your blood vessels (Doppler ultrasound).  Injecting a dye into your blood vessels before having imaging studies using:  X-rays (angiogram or arteriogram).  Computer-generated X-rays (CT angiogram).  A powerful electromagnetic field and a computer (magnetic resonance angiogram or MRA). TREATMENT Treatment for PVD depends on the cause of your condition and the severity of your symptoms. It also depends on your age. Underlying  causes need to be treated and controlled. These include long-lasting (chronic) conditions, such as diabetes, high cholesterol, and high blood pressure. You may need to first try making lifestyle changes and taking medicines. Surgery may be needed if these do not work. Lifestyle changes may include:  Quitting smoking.  Exercising regularly.  Following a low-fat, low-cholesterol diet. Medicines may include:  Blood thinners to prevent blood clots.  Medicines to improve blood flow.  Medicines to improve your blood cholesterol levels. Surgical procedures may include:  A procedure that uses an inflated balloon to open a blocked artery and improve blood flow (angioplasty).  A procedure to put in a tube (stent) to keep a blocked artery open (stent implant).  Surgery to reroute blood flow around a blocked artery (peripheral bypass surgery).  Surgery to remove dead tissue from an infected wound on the affected limb.  Amputation. This is surgical removal of the affected limb. This may be necessary in cases of acute ischemic limb that are not improved through medical or surgical treatments. HOME CARE INSTRUCTIONS  Take medicines only as directed by your health care provider.  Do not use any tobacco products, including cigarettes, chewing tobacco, or electronic cigarettes. If you need help quitting, ask your health care provider.  Lose weight if you are overweight, and maintain a healthy weight as directed by your health care provider.  Eat a diet that is low in fat and cholesterol. If you need help, ask your health care provider.  Exercise regularly. Ask your health care provider to suggest some good activities for you.  Use compression stockings or other mechanical devices as directed by your health care provider.  Take good care of your feet.  Wear comfortable shoes that fit well.  Check your feet often for any cuts or sores. SEEK MEDICAL CARE IF:  You have cramps in your legs  while walking.  You have leg pain when you are at rest.  You have coldness in a leg or foot.  Your skin changes.  You have erectile dysfunction.  You have cuts or sores on your feet that are not healing. SEEK IMMEDIATE MEDICAL CARE IF:  Your arm or leg turns cold and blue.  Your arms or legs become red, warm, swollen, painful, or numb.  You have chest pain or trouble breathing.  You suddenly have weakness in your face, arm, or leg.  You become very confused or lose the ability to speak.  You suddenly have a very bad headache or lose your vision.   This information is not intended to replace advice given to you by your health care provider. Make sure you discuss any questions you have with your health care provider.   Document Released: 12/08/2004 Document Revised: 11/21/2014 Document Reviewed: 04/10/2014 [employer] Interactive Patient Education 2016 [employer]  

## 2015-10-21 NOTE — Progress Notes (Signed)
[facility] [place] HISTORY AND PHYSICAL -PAD  History of Present Illness [name] [name] is a 81 y.o. female patient of Dr. Edilia Bo who is s/p right femoral to anterior tibial artery bypass graft with Dr. Madilyn Fireman in 2009. She returns today for routine follow up. I last saw pt on 05/13/15. At that time she had no lower extremity ulcers, no open wounds. It seemed like she does not walk enough to elicit claudication symptoms, she walks with a cane. 05/13/15 right LE arterial Duplex suggested a patent right femoral to anterior tibial artery bypass graft. Decreased bilateral ankle brachial indices since study on 05/08/2014. Right LE ABI decreased from normal to mild arterial occlusive disease in the last year.  Significant decrease in left LE ABI in the last year, from moderate arterial occlusive disease to critical limb ischemia; however, she remains asymptomatic.  I advised that she return in 3 months.  Pt states her insurance did not allow to return until now.   She has been performing seated daily leg exercises as advised at her last visit.   Pt states she has no known kidney problems; no recent creatinine result on file. [name] states pt had a TIA about 2008, denies any other TIA's or strokes.   Grandson states pt has trouble with falling and does not walk much; states sore appeared at her lateral left lower leg about 2 months ago, and another at her lateral left ankle about 3-5 months ago. Grandson states that pt sleeps sitting on the couch with her legs dependent.   The patient denies New Medical or Surgical History.  She had physical therapy for vertigo recently which helped.  Pt Diabetic: Yes, grandson states pt's glucose runs 80-160 Pt smoker: non-smoker  Pt meds include: Statin :No, grandson states her cholesterol is OK Betablocker: No ASA: Yes Other anticoagulants/antiplatelets: no   Past Medical History  Diagnosis Date  . Diabetes mellitus   .  Hypertension   . Arthritis   . RA (rheumatoid arthritis)   . Peripheral vascular disease   . Anemia   . Irregular heart beat   . Coronary artery disease   . Stroke 2008  . Myocardial infarction [facility]) 2008    Social History Social History  Substance Use Topics  . Smoking status: Never Smoker   . Smokeless tobacco: Never Used  . Alcohol Use: No    Family History Family History  Problem Relation Age of Onset  . Cancer Sister     breast cancer  . Diabetes Sister   . Hypertension Sister   . Varicose Veins Sister   . Heart attack Sister   . Peripheral vascular disease Sister   . Diabetes Mother   . Heart disease Mother     Before age 31  . Hypertension Mother   . Diabetes Father   . Deep vein thrombosis Father   . Heart disease Father     Before age 86  . Hypertension Father   . Heart attack Father   . Cancer Brother   . Diabetes Brother   . Heart disease Brother     Before age 76  . Heart attack Brother   . Diabetes Daughter   . Hyperlipidemia Daughter   . Cancer Daughter     Blood  . Hypertension Daughter   . Varicose Veins Daughter   . Deep vein thrombosis Son     Bleeding problems    Past Surgical History  Procedure Laterality Date  . Appendectomy    .  Tubal ligation    . Cholecystectomy      Gall Bladder  . Pr vein bypass graft,aorto-fem-pop  12/27/2007    Right Femoral-tibial BPG by Dr. Madilyn Fireman    Allergies  Allergen Reactions  . Erythromycin Hives, Nausea And Vomiting and Rash  . Penicillins Hives, Nausea And Vomiting and Rash    Current Outpatient Prescriptions  Medication Sig Dispense Refill  . aspirin 81 MG tablet Take 81 mg by mouth daily.    . digoxin (LANOXIN) 0.25 MG tablet Take 250 mcg by mouth daily.    . enalapril (VASOTEC) 5 MG tablet Take 5 mg by mouth daily.    . furosemide (LASIX) 40 MG tablet Take 40 mg by mouth as needed. For swelling    . insulin aspart (NOVOLOG) 100 UNIT/ML injection Inject 4-8 Units into the skin 3 (three)  times daily before meals. ssi    . insulin detemir (LEVEMIR) 100 UNIT/ML injection Inject 20 Units into the skin daily. May take 17  units in the evening if cbg is high     No current facility-administered medications for this visit.    ROS: See HPI for pertinent positives and negatives.   Physical Examination  Filed Vitals:   10/21/15 1049  BP: 122/74  Pulse: 85  Temp: 97.3 F (36.3 C)  TempSrc: Oral  Height: 5\' 3"  (1.6 m)  Weight: 131 lb 6.4 oz (59.603 kg)  SpO2: 100%   Body mass index is 23.28 kg/(m^2).   General: A&O x 3, WDWN. Gait: slow and deliberate, using cane Eyes: PERRLA. Pulmonary: CTAB, without wheezes , rales or rhonchi. Cardiac: regular rythm, no detected murmur.     Carotid Bruits Right Left   Negative Negative  Aorta is not palpable. Radial pulses: are 1+ palpable and =   VASCULAR EXAM: Extremities without ischemic changes, without Gangrene. Peripheral edema: pitting and non pitting: 2+ right ankle/foot, 1+ left ankle/foot  Ulcers: 1.5 x 1.5 cm moist shallow ulcer left lower lateral leg. Dry shallow ulcer at left ankle, lateral aspect, also 1.5 x 1.5 cm     LE Pulses Right Left   FEMORAL  palpable  palpable    POPLITEAL not palpable  not palpable   POSTERIOR TIBIAL not palpable  not palpable    DORSALIS PEDIS  ANTERIOR TIBIAL 1+ palpable  not palpable    Abdomen: soft, NT, no palpable masses. Skin: no rashes, see Extremities. Musculoskeletal: no muscle wasting or atrophy. Neurologic: A&O X 3; Appropriate Affect,; MOTOR FUNCTION: moving all extremities equally, motor strength 5/5 throughout. Speech is fluent/normal.  CN 2-12 intact.           Non-Invasive Vascular Imaging: DATE: 10/21/2015 ABI: RIGHT: 0.98  (0.83), Waveforms: bi and triphasic, TBI: 0.58;  LEFT: 0.43 (0.38), Waveforms: monophasic, TBI: not obtainable  DUPLEX SCAN OF BYPASS: No internal vessel narrowing noted in the right leg bypass graft or anastomosis.  Evidence suggestive of a left distal SFA to proximal popliteal artery occlusion and a 50-74% left proximal/mid SFA stenosis.     ASSESSMENT: [name] [name] is a 81 y.o. female who is s/p right femoral to anterior tibial artery bypass graft in 2009. It seems like she does not walk enough to elicit claudication symptoms. She developed 2 small shallow ulcers at the lateral aspect of her lower left leg and lateral aspect of left ankle. The first ulcer at her ankle developed 3-5 months ago and is dry, the second about 2 months ago and is moist; both ulcers measure about  1.5 cm in diameter and have no signs of infection.  She keeps her legs dependent, even when sleeping. She has 1+ and 2+ pitting and non pitting edema in both lower legs/feet with leathery changes to the skin in the ankles and feet, no hemosiderin deposits.   Today's bilateral LE arterial duplex suggests no internal vessel narrowing in the right leg bypass graft or anastomosis.  Evidence suggestive of a left distal SFA to proximal popliteal artery occlusion and a 50-74% left proximal/mid SFA stenosis. ABI's: right LE in normal range with bi and triphasic waveforms; left ABI indicates severe arterial occlusive disease with monophasic waveforms.  The 2 new left lower leg ulcers may be venous stasis ulcers, but the treatment for this (compression and elevation), may exacerbate the high degree of arterial stenosis that she has in her left leg. Will schedule for arteriogram with possible intervention of left leg.   Face to face time with patient was 25 minutes. Over 50% of this time was spent on counseling and coordination of care.   PLAN:  Based on the patient's vascular studies and examination, and after discussing with Dr.  Edilia Bo, pt will be scheduled for arteriogram with bilateral LE run off, possible left lower extremity intervention.  I discussed in depth with the patient the nature of atherosclerosis, and emphasized the importance of maximal medical management including strict control of blood pressure, blood glucose, and lipid levels, obtaining regular exercise, and continued cessation of smoking.  The patient is aware that without maximal medical management the underlying atherosclerotic disease process will progress, limiting the benefit of any interventions.  The patient was given information about PAD including signs, symptoms, treatment, what symptoms should prompt the patient to seek immediate medical care, and risk reduction measures to take.  Charisse March, RN, MSN, FNP-C [facility] Phone: [phone]  Clinic MD: Edilia Bo  10/21/2015 10:49 AM

## 2015-11-02 ENCOUNTER — Ambulatory Visit (HOSPITAL_COMMUNITY)
Admission: RE | Admit: 2015-11-02 | Discharge: 2015-11-02 | Disposition: A | Discharge status: Home / Self Care | Payer: Medicare Other | Source: Ambulatory Visit | Attending: Vascular Surgery | Admitting: Vascular Surgery

## 2015-11-02 ENCOUNTER — Encounter (HOSPITAL_COMMUNITY)
Admission: RE | Disposition: A | Discharge status: Home / Self Care | Payer: Self-pay | Source: Ambulatory Visit | Attending: Vascular Surgery

## 2015-11-02 DIAGNOSIS — Z9861 Coronary angioplasty status: Secondary | ICD-10-CM

## 2015-11-02 DIAGNOSIS — Z794 Long term (current) use of insulin: Secondary | ICD-10-CM | POA: Diagnosis not present

## 2015-11-02 DIAGNOSIS — Z880 Allergy status to penicillin: Secondary | ICD-10-CM | POA: Insufficient documentation

## 2015-11-02 DIAGNOSIS — Z8249 Family history of ischemic heart disease and other diseases of the circulatory system: Secondary | ICD-10-CM | POA: Insufficient documentation

## 2015-11-02 DIAGNOSIS — D649 Anemia, unspecified: Secondary | ICD-10-CM | POA: Diagnosis not present

## 2015-11-02 DIAGNOSIS — M069 Rheumatoid arthritis, unspecified: Secondary | ICD-10-CM | POA: Diagnosis not present

## 2015-11-02 DIAGNOSIS — I252 Old myocardial infarction: Secondary | ICD-10-CM | POA: Insufficient documentation

## 2015-11-02 DIAGNOSIS — Z7982 Long term (current) use of aspirin: Secondary | ICD-10-CM | POA: Insufficient documentation

## 2015-11-02 DIAGNOSIS — I70242 Atherosclerosis of native arteries of left leg with ulceration of calf: Secondary | ICD-10-CM | POA: Insufficient documentation

## 2015-11-02 DIAGNOSIS — I70248 Atherosclerosis of native arteries of left leg with ulceration of other part of lower left leg: Secondary | ICD-10-CM | POA: Diagnosis not present

## 2015-11-02 DIAGNOSIS — I70243 Atherosclerosis of native arteries of left leg with ulceration of ankle: Secondary | ICD-10-CM | POA: Diagnosis not present

## 2015-11-02 DIAGNOSIS — L97229 Non-pressure chronic ulcer of left calf with unspecified severity: Secondary | ICD-10-CM | POA: Insufficient documentation

## 2015-11-02 DIAGNOSIS — I10 Essential (primary) hypertension: Secondary | ICD-10-CM | POA: Insufficient documentation

## 2015-11-02 DIAGNOSIS — Z8673 Personal history of transient ischemic attack (TIA), and cerebral infarction without residual deficits: Secondary | ICD-10-CM | POA: Insufficient documentation

## 2015-11-02 DIAGNOSIS — I2510 Atherosclerotic heart disease of native coronary artery without angina pectoris: Secondary | ICD-10-CM | POA: Diagnosis not present

## 2015-11-02 DIAGNOSIS — E1151 Type 2 diabetes mellitus with diabetic peripheral angiopathy without gangrene: Secondary | ICD-10-CM | POA: Diagnosis not present

## 2015-11-02 DIAGNOSIS — L97329 Non-pressure chronic ulcer of left ankle with unspecified severity: Secondary | ICD-10-CM | POA: Insufficient documentation

## 2015-11-02 HISTORY — PX: PERIPHERAL VASCULAR CATHETERIZATION: SHX172C

## 2015-11-02 HISTORY — PX: LOWER EXTREMITY ANGIOGRAM: SHX5508

## 2015-11-02 LAB — POCT I-STAT, CHEM 8
BUN: 22 mg/dL — AB (ref 6–20)
CALCIUM ION: 1.14 mmol/L (ref 1.13–1.30)
CHLORIDE: 100 mmol/L — AB (ref 101–111)
Creatinine, Ser: 0.7 mg/dL (ref 0.44–1.00)
GLUCOSE: 167 mg/dL — AB (ref 65–99)
HCT: 36 % (ref 36.0–46.0)
Hemoglobin: 12.2 g/dL (ref 12.0–15.0)
Potassium: 4.1 mmol/L (ref 3.5–5.1)
Sodium: 140 mmol/L (ref 135–145)
TCO2: 28 mmol/L (ref 0–100)

## 2015-11-02 LAB — GLUCOSE, CAPILLARY: Glucose-Capillary: 141 mg/dL — ABNORMAL HIGH (ref 65–99)

## 2015-11-02 SURGERY — ABDOMINAL AORTOGRAM
Anesthesia: LOCAL

## 2015-11-02 MED ORDER — SODIUM CHLORIDE 0.9 % IV SOLN: 1.0000 mL/kg/h | INTRAVENOUS | Status: DC

## 2015-11-02 MED ORDER — IODIXANOL 320 MG/ML IV SOLN
INTRAVENOUS | Status: DC | PRN
  Administered 2015-11-02: 100 mL via INTRA_ARTERIAL

## 2015-11-02 MED ORDER — HEPARIN (PORCINE) IN NACL 2-0.9 UNIT/ML-% IJ SOLN
INTRAMUSCULAR | Status: AC
  Filled 2015-11-02: qty 1000

## 2015-11-02 MED ORDER — SODIUM CHLORIDE 0.9 % IV SOLN
INTRAVENOUS | Status: DC
  Administered 2015-11-02: 07:00:00 via INTRAVENOUS

## 2015-11-02 MED ORDER — LIDOCAINE HCL (PF) 1 % IJ SOLN
INTRAMUSCULAR | Status: AC
  Filled 2015-11-02: qty 30

## 2015-11-02 MED ORDER — LIDOCAINE HCL (PF) 1 % IJ SOLN
INTRAMUSCULAR | Status: DC | PRN
  Administered 2015-11-02: 14 mL

## 2015-11-02 SURGICAL SUPPLY — 14 items
CATH ANGIO 5F PIGTAIL 65CM (CATHETERS) ×3 IMPLANT
CATH CROSS OVER TEMPO 5F (CATHETERS) ×3 IMPLANT
CATH SOFT-VU 4F 65 STRAIGHT (CATHETERS) ×2 IMPLANT
CATH SOFT-VU STRAIGHT 4F 65CM (CATHETERS) ×1
COVER PRB 48X5XTLSCP FOLD TPE (BAG) ×2 IMPLANT
COVER PROBE 5X48 (BAG) ×1
GUIDEWIRE ANGLED .035X150CM (WIRE) ×3 IMPLANT
KIT MICROINTRODUCER STIFF 5F (SHEATH) ×3 IMPLANT
KIT PV (KITS) ×3 IMPLANT
SHEATH PINNACLE 5F 10CM (SHEATH) ×3 IMPLANT
SYR MEDRAD MARK V 150ML (SYRINGE) ×3 IMPLANT
TRANSDUCER W/STOPCOCK (MISCELLANEOUS) ×3 IMPLANT
TRAY PV CATH (CUSTOM PROCEDURE TRAY) ×3 IMPLANT
WIRE HI TORQ VERSACORE-J 145CM (WIRE) ×3 IMPLANT

## 2015-11-02 NOTE — Discharge Instructions (Signed)

## 2015-11-02 NOTE — Interval H&P Note (Signed)
History and Physical Interval Note:  11/02/2015 8:13 AM  [name] [name]  has presented today for surgery, with the diagnosis of LLE ulcer, Left SFA occulsion  The various methods of treatment have been discussed with the patient and family. After consideration of risks, benefits and other options for treatment, the patient has consented to  Procedure(s): Abdominal Aortogram (N/A) as a surgical intervention .  The patient's history has been reviewed, patient examined, no change in status, stable for surgery.  I have reviewed the patient's chart and labs.  Questions were answered to the patient's satisfaction.     Waverly Ferrari

## 2015-11-02 NOTE — Op Note (Signed)
   PATIENT: [name] [name]   MRN: [id number] DOB: 01/11/1931    DATE OF PROCEDURE: 11/02/2015  INDICATIONS: [name] is a 81 y.o. female who was seen in our office on 10/21/2015 with some small ulcers on her left leg and evidence of severe infrainguinal arterial occlusive disease. She presents for an arteriogram.  PROCEDURE:  1. Ultrasound-guided access to the right common femoral artery 2. Aortogram with iliac arteriogram 3. Selective catheterization of the left external iliac artery with left lower extremity runoff 4. Retrograde right femoral arteriogram  SURGEON: Di Kindle. Edilia Bo, MD, FACS  ANESTHESIA: local   EBL: minimal  TECHNIQUE: The patient was taken to the peripheral vascular lab. Both groins were prepped and draped in the usual sterile fashion. Under ultrasound guidance, after the skin was anesthetized, the right common femoral artery was cannulated above the origin of the femoral to anterior tibial artery bypass graft. A micropuncture sheath was introduced over the wire and this was exchanged for a 5 French sheath over a versa core wire. A pigtail catheter was positioned at the L1 vertebral body and flush aortogram obtained. The catheter was then repositioned above the aortic bifurcation and exchanged for a crossover catheter. An angled Glidewire was advanced down to the external iliac artery and the crossover catheter exchanged for a 4 French straight catheter. Selective left external iliac arteriogram was obtained with left lower extremity runoff. Next the catheter was removed and a retrograde right femoral arteriogram obtained. At the completion the patient was transferred to the holding area for removal of the sheath.  FINDINGS:  1. There are single renal arteries bilaterally with no significant renal artery stenosis identified. 2. The infrarenal aorta, bilateral common iliac arteries, bilateral hypogastric arteries, and bilateral external iliac arteries are patent. 3.  On the left side, the common femoral and deep femoral arteries are patent. The proximal superficial femoral artery is patent. There is a focal stenosis in the proximal thigh and then shortly below that the artery is occluded. The superficial femoral artery distally, the popliteal, the anterior tibial, the tibial peroneal trunk, the peroneal artery, and the posterior tibial arteries are all occluded. There is only a short segment of the anterior tibial artery that is patent. She has extensive collaterals suggesting chronic occlusion. 4. On the right side the external iliac, common femoral, deep femoral arteries are patent. The superficial femoral artery is occluded at its origin. The femoral to anterior tibial artery bypass graft is patent. The anterotibial artery has moderate diffuse disease. The peroneal and posterior tibial arteries are occluded.  CLINICAL NOTE: The patient has no options for revascularization on the left. However there are extensive collaterals and hopefully there is adequate circulation to heal the small wounds on her left leg.  Waverly Ferrari, MD, FACS [facility]  DATE OF DICTATION:   11/02/2015

## 2015-11-02 NOTE — Progress Notes (Signed)
Site area: RFA Site Prior to Removal:  Level 0 Pressure Applied For:20 min Manual: yes   Patient Status During Pull:  stable Post Pull Site:  Level 0 Post Pull Instructions Given: yes  Post Pull Pulses Present: palpable Dressing Applied:  clear Bedrest begins @  Comments:1025 till 1425

## 2015-11-02 NOTE — H&P (View-Only) (Signed)
[facility] [place] HISTORY AND PHYSICAL -PAD  History of Present Illness [name] [name] is a 81 y.o. female patient of Dr. Dickson who is s/p right femoral to anterior tibial artery bypass graft with Dr. Hayes in 2009. She returns today for routine follow up. I last saw pt on 05/13/15. At that time she had no lower extremity ulcers, no open wounds. It seemed like she does not walk enough to elicit claudication symptoms, she walks with a cane. 05/13/15 right LE arterial Duplex suggested a patent right femoral to anterior tibial artery bypass graft. Decreased bilateral ankle brachial indices since study on 05/08/2014. Right LE ABI decreased from normal to mild arterial occlusive disease in the last year.  Significant decrease in left LE ABI in the last year, from moderate arterial occlusive disease to critical limb ischemia; however, she remains asymptomatic.  I advised that she return in 3 months.  Pt states her insurance did not allow to return until now.   She has been performing seated daily leg exercises as advised at her last visit.   Pt states she has no known kidney problems; no recent creatinine result on file. Grandson states pt had a TIA about 2008, denies any other TIA's or strokes.   Grandson states pt has trouble with falling and does not walk much; states sore appeared at her lateral left lower leg about 2 months ago, and another at her lateral left ankle about 3-5 months ago. Grandson states that pt sleeps sitting on the couch with her legs dependent.   The patient denies New Medical or Surgical History.  She had physical therapy for vertigo recently which helped.  Pt Diabetic: Yes, grandson states pt's glucose runs 80-160 Pt smoker: non-smoker  Pt meds include: Statin :No, grandson states her cholesterol is OK Betablocker: No ASA: Yes Other anticoagulants/antiplatelets: no   Past Medical History  Diagnosis Date  . Diabetes mellitus   .  Hypertension   . Arthritis   . RA (rheumatoid arthritis)   . Peripheral vascular disease   . Anemia   . Irregular heart beat   . Coronary artery disease   . Stroke 2008  . Myocardial infarction ([facility]) 2008    Social History Social History  Substance Use Topics  . Smoking status: Never Smoker   . Smokeless tobacco: Never Used  . Alcohol Use: No    Family History Family History  Problem Relation Age of Onset  . Cancer Sister     breast cancer  . Diabetes Sister   . Hypertension Sister   . Varicose Veins Sister   . Heart attack Sister   . Peripheral vascular disease Sister   . Diabetes Mother   . Heart disease Mother     Before age 60  . Hypertension Mother   . Diabetes Father   . Deep vein thrombosis Father   . Heart disease Father     Before age 60  . Hypertension Father   . Heart attack Father   . Cancer Brother   . Diabetes Brother   . Heart disease Brother     Before age 60  . Heart attack Brother   . Diabetes Daughter   . Hyperlipidemia Daughter   . Cancer Daughter     Blood  . Hypertension Daughter   . Varicose Veins Daughter   . Deep vein thrombosis Son     Bleeding problems    Past Surgical History  Procedure Laterality Date  . Appendectomy    .   Tubal ligation    . Cholecystectomy      Gall Bladder  . Pr vein bypass graft,aorto-fem-pop  12/27/2007    Right Femoral-tibial BPG by Dr. Madilyn Fireman    Allergies  Allergen Reactions  . Erythromycin Hives, Nausea And Vomiting and Rash  . Penicillins Hives, Nausea And Vomiting and Rash    Current Outpatient Prescriptions  Medication Sig Dispense Refill  . aspirin 81 MG tablet Take 81 mg by mouth daily.    . digoxin (LANOXIN) 0.25 MG tablet Take 250 mcg by mouth daily.    . enalapril (VASOTEC) 5 MG tablet Take 5 mg by mouth daily.    . furosemide (LASIX) 40 MG tablet Take 40 mg by mouth as needed. For swelling    . insulin aspart (NOVOLOG) 100 UNIT/ML injection Inject 4-8 Units into the skin 3 (three)  times daily before meals. ssi    . insulin detemir (LEVEMIR) 100 UNIT/ML injection Inject 20 Units into the skin daily. May take 17  units in the evening if cbg is high     No current facility-administered medications for this visit.    ROS: See HPI for pertinent positives and negatives.   Physical Examination  Filed Vitals:   10/21/15 1049  BP: 122/74  Pulse: 85  Temp: 97.3 F (36.3 C)  TempSrc: Oral  Height: 5\' 3"  (1.6 m)  Weight: 131 lb 6.4 oz (59.603 kg)  SpO2: 100%   Body mass index is 23.28 kg/(m^2).   General: A&O x 3, WDWN. Gait: slow and deliberate, using cane Eyes: PERRLA. Pulmonary: CTAB, without wheezes , rales or rhonchi. Cardiac: regular rythm, no detected murmur.     Carotid Bruits Right Left   Negative Negative  Aorta is not palpable. Radial pulses: are 1+ palpable and =   VASCULAR EXAM: Extremities without ischemic changes, without Gangrene. Peripheral edema: pitting and non pitting: 2+ right ankle/foot, 1+ left ankle/foot  Ulcers: 1.5 x 1.5 cm moist shallow ulcer left lower lateral leg. Dry shallow ulcer at left ankle, lateral aspect, also 1.5 x 1.5 cm     LE Pulses Right Left   FEMORAL  palpable  palpable    POPLITEAL not palpable  not palpable   POSTERIOR TIBIAL not palpable  not palpable    DORSALIS PEDIS  ANTERIOR TIBIAL 1+ palpable  not palpable    Abdomen: soft, NT, no palpable masses. Skin: no rashes, see Extremities. Musculoskeletal: no muscle wasting or atrophy. Neurologic: A&O X 3; Appropriate Affect,; MOTOR FUNCTION: moving all extremities equally, motor strength 5/5 throughout. Speech is fluent/normal.  CN 2-12 intact.           Non-Invasive Vascular Imaging: DATE: 10/21/2015 ABI: RIGHT: 0.98  (0.83), Waveforms: bi and triphasic, TBI: 0.58;  LEFT: 0.43 (0.38), Waveforms: monophasic, TBI: not obtainable  DUPLEX SCAN OF BYPASS: No internal vessel narrowing noted in the right leg bypass graft or anastomosis.  Evidence suggestive of a left distal SFA to proximal popliteal artery occlusion and a 50-74% left proximal/mid SFA stenosis.     ASSESSMENT: [name] [name] is a 81 y.o. female who is s/p right femoral to anterior tibial artery bypass graft in 2009. It seems like she does not walk enough to elicit claudication symptoms. She developed 2 small shallow ulcers at the lateral aspect of her lower left leg and lateral aspect of left ankle. The first ulcer at her ankle developed 3-5 months ago and is dry, the second about 2 months ago and is moist; both ulcers measure about  1.5 cm in diameter and have no signs of infection.  She keeps her legs dependent, even when sleeping. She has 1+ and 2+ pitting and non pitting edema in both lower legs/feet with leathery changes to the skin in the ankles and feet, no hemosiderin deposits.   Today's bilateral LE arterial duplex suggests no internal vessel narrowing in the right leg bypass graft or anastomosis.  Evidence suggestive of a left distal SFA to proximal popliteal artery occlusion and a 50-74% left proximal/mid SFA stenosis. ABI's: right LE in normal range with bi and triphasic waveforms; left ABI indicates severe arterial occlusive disease with monophasic waveforms.  The 2 new left lower leg ulcers may be venous stasis ulcers, but the treatment for this (compression and elevation), may exacerbate the high degree of arterial stenosis that she has in her left leg. Will schedule for arteriogram with possible intervention of left leg.   Face to face time with patient was 25 minutes. Over 50% of this time was spent on counseling and coordination of care.   PLAN:  Based on the patient's vascular studies and examination, and after discussing with Dr.  Edilia Bo, pt will be scheduled for arteriogram with bilateral LE run off, possible left lower extremity intervention.  I discussed in depth with the patient the nature of atherosclerosis, and emphasized the importance of maximal medical management including strict control of blood pressure, blood glucose, and lipid levels, obtaining regular exercise, and continued cessation of smoking.  The patient is aware that without maximal medical management the underlying atherosclerotic disease process will progress, limiting the benefit of any interventions.  The patient was given information about PAD including signs, symptoms, treatment, what symptoms should prompt the patient to seek immediate medical care, and risk reduction measures to take.  Charisse March, RN, MSN, FNP-C [facility] Phone: [phone]  Clinic MD: Edilia Bo  10/21/2015 10:49 AM

## 2015-11-03 ENCOUNTER — Encounter (HOSPITAL_COMMUNITY): Payer: Self-pay | Admitting: Vascular Surgery

## 2015-11-04 ENCOUNTER — Telehealth: Payer: Self-pay | Admitting: Vascular Surgery

## 2015-11-04 NOTE — Telephone Encounter (Signed)
-----   Message from Sharee Pimple, RN sent at 11/02/2015 11:03 AM EST ----- Regarding: schedule   ----- Message -----    From: Chuck Hint, MD    Sent: 11/02/2015   9:43 AM      To: [facility] Charge Pool Subject: charge                                         PROCEDURE:  1. Ultrasound-guided access to the right common femoral artery 2. Aortogram with iliac arteriogram 3. Selective catheterization of the left external iliac artery with left lower extremity runoff 4. Retrograde right femoral arteriogram  SURGEON: Di Kindle. Edilia Bo, MD, FACS She has no options for revascularization. She should continue with aggressive wound care and can continue to follow up with Rosalita Chessman in 3 months. CD

## 2015-11-04 NOTE — Telephone Encounter (Signed)
Phone # is busy, mailed letter, dpm

## 2015-11-19 ENCOUNTER — Encounter: Payer: Self-pay | Admitting: Vascular Surgery

## 2015-12-16 DIAGNOSIS — I219 Acute myocardial infarction, unspecified: Secondary | ICD-10-CM

## 2015-12-16 HISTORY — DX: Acute myocardial infarction, unspecified: I21.9

## 2015-12-16 HISTORY — PX: CARDIAC CATHETERIZATION: SHX172

## 2015-12-21 ENCOUNTER — Other Ambulatory Visit (HOSPITAL_COMMUNITY): Payer: Self-pay

## 2015-12-21 ENCOUNTER — Ambulatory Visit (HOSPITAL_COMMUNITY)
Admission: AD | Admit: 2015-12-21 | Discharge: 2015-12-21 | Disposition: A | Discharge status: Home / Self Care | Payer: Medicare Other | Source: Other Acute Inpatient Hospital | Attending: Internal Medicine | Admitting: Internal Medicine

## 2015-12-21 ENCOUNTER — Inpatient Hospital Stay
Admission: AD | Admit: 2015-12-21 | Discharge: 2016-01-01 | Disposition: A | Discharge status: Skilled Nursing Facility | Payer: Medicare Other | Source: Ambulatory Visit | Attending: Internal Medicine | Admitting: Internal Medicine

## 2015-12-21 DIAGNOSIS — R079 Chest pain, unspecified: Secondary | ICD-10-CM | POA: Diagnosis not present

## 2015-12-21 DIAGNOSIS — J9690 Respiratory failure, unspecified, unspecified whether with hypoxia or hypercapnia: Secondary | ICD-10-CM | POA: Insufficient documentation

## 2015-12-21 DIAGNOSIS — I739 Peripheral vascular disease, unspecified: Secondary | ICD-10-CM | POA: Diagnosis present

## 2015-12-21 DIAGNOSIS — Z4659 Encounter for fitting and adjustment of other gastrointestinal appliance and device: Secondary | ICD-10-CM

## 2015-12-21 DIAGNOSIS — I471 Supraventricular tachycardia: Secondary | ICD-10-CM | POA: Diagnosis present

## 2015-12-21 DIAGNOSIS — I2510 Atherosclerotic heart disease of native coronary artery without angina pectoris: Secondary | ICD-10-CM

## 2015-12-21 DIAGNOSIS — Z9861 Coronary angioplasty status: Secondary | ICD-10-CM

## 2015-12-21 DIAGNOSIS — I469 Cardiac arrest, cause unspecified: Secondary | ICD-10-CM | POA: Diagnosis not present

## 2015-12-22 DIAGNOSIS — R29898 Other symptoms and signs involving the musculoskeletal system: Secondary | ICD-10-CM

## 2015-12-22 DIAGNOSIS — J9601 Acute respiratory failure with hypoxia: Secondary | ICD-10-CM | POA: Diagnosis not present

## 2015-12-22 DIAGNOSIS — E46 Unspecified protein-calorie malnutrition: Secondary | ICD-10-CM

## 2015-12-22 LAB — BLOOD GAS, ARTERIAL
ACID-BASE EXCESS: 6.2 mmol/L — AB (ref 0.0–2.0)
BICARBONATE: 29.5 meq/L — AB (ref 20.0–24.0)
FIO2: 0.28
MECHVT: 400 mL
O2 SAT: 99 %
PEEP: 5 cmH2O
PH ART: 7.517 — AB (ref 7.350–7.450)
Patient temperature: 98.6
RATE: 12 resp/min
TCO2: 30.6 mmol/L (ref 0–100)
pCO2 arterial: 36.5 mmHg (ref 35.0–45.0)
pO2, Arterial: 148 mmHg — ABNORMAL HIGH (ref 80.0–100.0)

## 2015-12-22 LAB — BASIC METABOLIC PANEL
ANION GAP: 13 (ref 5–15)
BUN: 35 mg/dL — ABNORMAL HIGH (ref 6–20)
CHLORIDE: 102 mmol/L (ref 101–111)
CO2: 31 mmol/L (ref 22–32)
Calcium: 8.4 mg/dL — ABNORMAL LOW (ref 8.9–10.3)
Creatinine, Ser: 0.79 mg/dL (ref 0.44–1.00)
GFR calc non Af Amer: >60 mL/min (ref >60)
Glucose, Bld: 244 mg/dL — ABNORMAL HIGH (ref 65–99)
POTASSIUM: 3.1 mmol/L — AB (ref 3.5–5.1)
Sodium: 146 mmol/L — ABNORMAL HIGH (ref 135–145)

## 2015-12-22 LAB — CBC
HCT: 29.4 % — ABNORMAL LOW (ref 36.0–46.0)
HEMOGLOBIN: 9.4 g/dL — AB (ref 12.0–15.0)
MCH: 26.6 pg (ref 26.0–34.0)
MCHC: 32 g/dL (ref 30.0–36.0)
MCV: 83.3 fL (ref 78.0–100.0)
Platelets: 252 10*3/uL (ref 150–400)
RBC: 3.53 MIL/uL — AB (ref 3.87–5.11)
RDW: 15 % (ref 11.5–15.5)
WBC: 10.2 10*3/uL (ref 4.0–10.5)

## 2015-12-22 NOTE — Consult Note (Signed)
Name: [name] [name] MRN: [id number] DOB: 01/11/1931    ADMISSION DATE:  12/21/2015 CONSULTATION DATE:  2/6  REFERRING MD :  Hijazi  CHIEF COMPLAINT:  VDRF  HISTORY OF PRESENT ILLNESS:  82 year old female with PMH as below, which includes DM, HTN, PVD (s/p R fem bypass, failed recent L fem bypass), and RA. She presented to [facility] 2/1 with complaints of substernal chest pain and SOB. EKG in the ED demonstrated new LBBB and troponin was elevated to 15. She was taken emergently for LHC and a 100% occluded left circumflex was discovered. DES was placed. Procedure complicated by respiratory compromise and resultant cardiac arrest (PEA) immediately after case ended while still in cath lab. Arrest was relatively short and included only one round of epinephrine. She was intubated and taken to ICU in cardiogenic shock. LVEF noted to be 20%. She was able to be weaned off pressors 2/4, however, agitation and delirium (despite 2 days sedation free) precluded weaning from vent. She was transferred to [facility] 2/6 for further vent management.   SIGNIFICANT EVENTS  2/1 STEMI, cardiac arrest in cath lab, intubated, admitted to ICU 2/4 Off pressors 2/6 transfer to [facility]  STUDIES:  CXR 2/7: no acute cardiopulmonary process  PAST MEDICAL HISTORY :   has a past medical history of Diabetes mellitus; Hypertension; Arthritis; RA (rheumatoid arthritis) (HCC); Peripheral vascular disease (HCC); Anemia; Irregular heart beat; Coronary artery disease; Stroke [facility]) (2008); and Myocardial infarction (HCC) (2008).  has past surgical history that includes Appendectomy; Tubal ligation; Cholecystectomy; vein bypass graft,aorto-fem-pop (12/27/2007); Cardiac catheterization (N/A, 11/02/2015); and lower extremity angiogram (Bilateral, 11/02/2015). Prior to Admission medications   Medication Sig Start Date End Date Taking? Authorizing Provider  aspirin 81 MG tablet Take 81 mg by mouth daily.    Historical Provider, MD    digoxin (LANOXIN) 0.25 MG tablet Take 250 mcg by mouth daily.    Historical Provider, MD  enalapril (VASOTEC) 5 MG tablet Take 5 mg by mouth daily.    Historical Provider, MD  furosemide (LASIX) 40 MG tablet Take 40 mg by mouth as needed. For swelling    Historical Provider, MD  insulin aspart (NOVOLOG) 100 UNIT/ML injection Inject 4-8 Units into the skin 3 (three) times daily before meals. ssi    Historical Provider, MD  insulin detemir (LEVEMIR) 100 UNIT/ML injection Inject 20 Units into the skin daily. May take 17  units in the evening if cbg is high    Historical Provider, MD   Allergies  Allergen Reactions  . Erythromycin Hives, Nausea And Vomiting and Rash  . Penicillins Hives, Nausea And Vomiting and Rash    Has patient had a PCN reaction causing immediate rash, facial/tongue/throat swelling, SOB or lightheadedness with hypotension: Yes Has patient had a PCN reaction causing severe rash involving mucus membranes or skin necrosis: No Has patient had a PCN reaction that required hospitalization No Has patient had a PCN reaction occurring within the last 10 years: No If all of the above answers are "NO", then may proceed with Cephalosporin use.     FAMILY HISTORY:  family history includes Cancer in her brother, daughter, and sister; Deep vein thrombosis in her father and son; Diabetes in her brother, daughter, father, mother, and sister; Heart attack in her brother, father, and sister; Heart disease in her brother, father, and mother; Hyperlipidemia in her daughter; Hypertension in her daughter, father, mother, and sister; Peripheral vascular disease in her sister; Varicose Veins in her daughter and sister. SOCIAL  HISTORY:  reports that she has never smoked. She has never used smokeless tobacco. She reports that she does not drink alcohol or use illicit drugs.  REVIEW OF SYSTEMS:   Unable due to mechanically ventilated.  SUBJECTIVE:  Currently weaning well on 12/5 > turned down to  5/5 looks good initially.   VITAL SIGNS:    PHYSICAL EXAMINATION: General:  Cachectic female in NAD on vent Neuro:  Alert, seemingly oriented, non-focal. Weak HEENT:  /AT, PERRL, no JVD Cardiovascular:  RRR, no MRG Lungs:  Clear bilateral breath sounds Abdomen:  Soft, non-tender, non-distended Musculoskeletal:  No acute deformity or ROM limitation. Skin:  Grossly intact   Recent Labs Lab 12/22/15 0615  NA 146*  K 3.1*  CL 102  CO2 31  BUN 35*  CREATININE 0.79  GLUCOSE 244*    Recent Labs Lab 12/22/15 0615  HGB 9.4*  HCT 29.4*  WBC 10.2  PLT 252   Dg Chest Port 1 View  12/21/2015  CLINICAL DATA:  Respiratory failure.  Transfer. EXAM: PORTABLE CHEST 1 VIEW COMPARISON:  12/12/2007 FINDINGS: Endotracheal tube terminates 3.3 cm above carina.Nasogastric tube extends beyond the inferior aspect of the film. Left-sided PICC line terminates at the cavoatrial junction. Midline trachea. Normal heart size. Atherosclerosis in the transverse aorta. No pleural effusion or pneumothorax. Numerous leads and wires project over the chest. No congestive failure. No lobar consolidation. Mild subsegmental atelectasis at the lung bases. IMPRESSION: No acute cardiopulmonary disease. Aortic atherosclerosis. Appropriate position of support apparatus. Electronically Signed   By: Jeronimo Greaves M.D.   On: 12/21/2015 18:39   Dg Abd Portable 1v  12/21/2015  CLINICAL DATA:  Pt is a new admission after being transferred from a different facility. Check and verify NG tube placement as well as ETT. EXAM: PORTABLE ABDOMEN - 1 VIEW COMPARISON:  Chest radiograph same date FINDINGS: Nasogastric terminates at the distal stomach. Multiple leads and wires project over the upper abdomen. Probable cholecystectomy clips. Non-obstructive bowel gas pattern. Large amount of stool distally. Vascular calcifications. IMPRESSION: Nasogastric tube terminating at the distal stomach. Question constipation or even mild fecal  impaction. Electronically Signed   By: Jeronimo Greaves M.D.   On: 12/21/2015 18:40    ASSESSMENT / PLAN:  VDRF secondary to cardiac arrest Acute systolic CHF secondary to ischemic cardiomyopathy (LVEF 20%) - Wean vent to 5/5 as tolerated - Possibly could extubate today if tolerated 5/5 for 2-3 hours, currently pulling good volumes - Continue diuresis as renal function/BP tolerate (current lasix 40mg  q 8 hours) - Duonebs to PRN - Vent bundle - Minimize sedating medications (PRN morphine, Oxy)  MSSA PNA UTI - Continue ABX per [facility]  DM - Management per [facility]   Joneen Roach, AGACNP-BC [place] [facility]/Critical Care Pager [phone] or [phone]  12/22/2015 11:06 AM   Attending:  I have seen and examined the patient with nurse practitioner/resident and agree with the note above.  We formulated the plan together and I elicited the following history.    [name] had a PEA arrest with her LHC on 2/1.  Since then she was encephalopathic and weak and unable to be extubated.  Brought to [facility] last night.  She has been on pressure support all morning, most recently 5/5 for 2 hours with 100% O2 saturation and normal respiratory effort.  On exam today she is awake, alert, able to pick head up from pillow, lungs clear to auscultation, vent supported breaths.  CV: mildly tachy, no mgr, GI bs+, soft, nontender  CXR images reviewed, clear lungs, no infiltrate  Acute respiratory failure with hypoxemia> no baseline lung disease, now passing SBT with clear lungs strength improving; will extubate to 2L [place] and monitor closely here in [facility] Deconditioning > continue PT Protein calorie malnutrition> SLP eval post extubation, dietary consult CAD> per primary  Daughter updated bedside by me  My cc time 35 minutes  Heber Westport, MD [place] PCCM Pager: [phone] Cell: [phone] After 3pm or if no response, call [phone]

## 2015-12-23 DIAGNOSIS — R079 Chest pain, unspecified: Secondary | ICD-10-CM | POA: Diagnosis not present

## 2015-12-23 DIAGNOSIS — I469 Cardiac arrest, cause unspecified: Secondary | ICD-10-CM | POA: Diagnosis not present

## 2015-12-23 DIAGNOSIS — I471 Supraventricular tachycardia: Secondary | ICD-10-CM | POA: Diagnosis present

## 2015-12-23 DIAGNOSIS — I2121 ST elevation (STEMI) myocardial infarction involving left circumflex coronary artery: Secondary | ICD-10-CM | POA: Diagnosis not present

## 2015-12-23 LAB — PHOSPHORUS: Phosphorus: 3 mg/dL (ref 2.5–4.6)

## 2015-12-23 LAB — CK TOTAL AND CKMB (NOT AT ARMC)
CK TOTAL: 258 U/L — AB (ref 38–234)
CK, MB: 1.7 ng/mL (ref 0.5–5.0)
CK, MB: 2.3 ng/mL (ref 0.5–5.0)
RELATIVE INDEX: 0.7 (ref 0.0–2.5)
RELATIVE INDEX: 1 (ref 0.0–2.5)
Total CK: 235 U/L — ABNORMAL HIGH (ref 38–234)

## 2015-12-23 LAB — TROPONIN I
TROPONIN I: 3.71 ng/mL — AB (ref <0.031)
TROPONIN I: 3.1 ng/mL — AB (ref <0.031)
Troponin I: 3.29 ng/mL (ref <0.031)
Troponin I: 2.84 ng/mL (ref <0.031)

## 2015-12-23 LAB — BASIC METABOLIC PANEL
ANION GAP: 11 (ref 5–15)
BUN: 35 mg/dL — AB (ref 6–20)
CHLORIDE: 106 mmol/L (ref 101–111)
CO2: 32 mmol/L (ref 22–32)
Calcium: 8.8 mg/dL — ABNORMAL LOW (ref 8.9–10.3)
Creatinine, Ser: 0.69 mg/dL (ref 0.44–1.00)
GFR calc Af Amer: >60 mL/min (ref >60)
GFR calc non Af Amer: >60 mL/min (ref >60)
GLUCOSE: 233 mg/dL — AB (ref 65–99)
Potassium: 3.5 mmol/L (ref 3.5–5.1)
Sodium: 149 mmol/L — ABNORMAL HIGH (ref 135–145)

## 2015-12-23 LAB — CBC
HCT: 33.3 % — ABNORMAL LOW (ref 36.0–46.0)
Hemoglobin: 10.6 g/dL — ABNORMAL LOW (ref 12.0–15.0)
MCH: 26.5 pg (ref 26.0–34.0)
MCHC: 31.8 g/dL (ref 30.0–36.0)
MCV: 83.3 fL (ref 78.0–100.0)
PLATELETS: 274 10*3/uL (ref 150–400)
RBC: 4 MIL/uL (ref 3.87–5.11)
RDW: 14.9 % (ref 11.5–15.5)
WBC: 9.9 10*3/uL (ref 4.0–10.5)

## 2015-12-23 LAB — MAGNESIUM: MAGNESIUM: 2.1 mg/dL (ref 1.7–2.4)

## 2015-12-23 NOTE — Consult Note (Signed)
Reason for Consult:   Chest pain  Requesting Physician: Dr Sharyon Medicus Primary Cardiologist Dr Georganna Skeans [facility])  HPI:   82 year old AA female with PMH of DM, HTN, and PVD (s/p R fem bypass, failed recent L fem bypass), and RA. She was evaluated by Dr Edilia Bo in December for LE ulcer with abnormal ABIs. He felt she had severe LE PV disease but had collateral circulation and no intervention was planned. She then had an OP Myoview 12/10/15 by Dr Georganna Skeans (family not sure why) which was normal. On 12/16/15 she presented to [facility] with chest pain and SOB. EKG in the ED demonstrated new LBBB and troponin was elevated to 15. She was taken emergently for LHC and a 100% occluded LAD (?) was discovered and DES was placed. The daughter I spoke to also thinks she had a total RCA with collaterals. The procedure was complicated by respiratory compromise and resultant cardiac arrest (PEA) immediately after case ended while still in cath lab. Arrest was relatively short and included only one round of epinephrine and CPR for about 2 minutes. Hospital course complicated by PSVT and mental status changes.  She was intubated and taken to ICU in cardiogenic shock. LVEF noted to be 20%. She was able to be weaned off pressors 2/4, however, agitation and delirium (despite 2 days sedation free) precluded weaning from vent. She was transferred to [facility] 2/6 for further vent management. She is now off the vent and awake and alert. We are asked to see her today after she had an episode of SSCP last night she describes as "indigestion". She says NTG SL helped. Her Troponin at 6am was 3.29- second Troponin at 0827 was 3.71. She is currently pain free.   PMHx:  Past Medical History  Diagnosis Date  . Diabetes mellitus   . Hypertension   . Arthritis   . RA (rheumatoid arthritis) (HCC)   . Peripheral vascular disease (HCC)   . Anemia   . Irregular heart beat   . Coronary artery disease   . Stroke [facility])  2008  . Myocardial infarction [facility] 2008    Past Surgical History  Procedure Laterality Date  . Appendectomy    . Tubal ligation    . Cholecystectomy      Gall Bladder  . Pr vein bypass graft,aorto-fem-pop  12/27/2007    Right Femoral-tibial BPG by Dr. Madilyn Fireman  . Peripheral vascular catheterization N/A 11/02/2015    Procedure: Abdominal Aortogram;  Surgeon: Chuck Hint, MD;  Location: [facility] INVASIVE CV LAB;  Service: Cardiovascular;  Laterality: N/A;  . Lower extremity angiogram Bilateral 11/02/2015    Procedure: Lower Extremity Angiogram;  Surgeon: Chuck Hint, MD;  Location: [facility] INVASIVE CV LAB;  Service: Cardiovascular;  Laterality: Bilateral;    SOCHx:  reports that she has never smoked. She has never used smokeless tobacco. She reports that she does not drink alcohol or use illicit drugs.  FAMHx: Family History  Problem Relation Age of Onset  . Cancer Sister     breast cancer  . Diabetes Sister   . Hypertension Sister   . Varicose Veins Sister   . Heart attack Sister   . Peripheral vascular disease Sister   . Diabetes Mother   . Heart disease Mother     Before age 62  . Hypertension Mother   . Diabetes Father   . Deep vein thrombosis Father   . Heart disease Father  Before age 64  . Hypertension Father   . Heart attack Father   . Cancer Brother   . Diabetes Brother   . Heart disease Brother     Before age 79  . Heart attack Brother   . Diabetes Daughter   . Hyperlipidemia Daughter   . Cancer Daughter     Blood  . Hypertension Daughter   . Varicose Veins Daughter   . Deep vein thrombosis Son     Bleeding problems    ALLERGIES: Allergies  Allergen Reactions  . Erythromycin Hives, Nausea And Vomiting and Rash  . Penicillins Hives, Nausea And Vomiting and Rash    Has patient had a PCN reaction causing immediate rash, facial/tongue/throat swelling, SOB or lightheadedness with hypotension: Yes Has patient had a PCN reaction causing severe  rash involving mucus membranes or skin necrosis: No Has patient had a PCN reaction that required hospitalization No Has patient had a PCN reaction occurring within the last 10 years: No If all of the above answers are "NO", then may proceed with Cephalosporin use.     ROS: Review of Systems: General: negative for chills, fever, night sweats or weight changes.  Cardiovascular: negative for dyspnea on exertion, edema, orthopnea, palpitations, paroxysmal nocturnal dyspnea or shortness of breath HEENT: negative for any visual disturbances, blindness, glaucoma Dermatological: negative for rash Respiratory: negative for cough, hemoptysis, or wheezing Urologic: negative for hematuria or dysuria Abdominal: negative for nausea, vomiting, diarrhea, bright red blood per rectum, melena, or hematemesis Neurologic: negative for visual changes, syncope, or dizziness Musculoskeletal: negative for back pain, joint pain, or swelling Psych: cooperative and appropriate All other systems reviewed and are otherwise negative except as noted above.   HOME MEDICATIONS: Prior to Admission medications   Medication Sig Start Date End Date Taking? Authorizing Provider  aspirin 81 MG tablet Take 81 mg by mouth daily.    Historical Provider, MD  digoxin (LANOXIN) 0.25 MG tablet Take 250 mcg by mouth daily.    Historical Provider, MD  enalapril (VASOTEC) 5 MG tablet Take 5 mg by mouth daily.    Historical Provider, MD  furosemide (LASIX) 40 MG tablet Take 40 mg by mouth as needed. For swelling    Historical Provider, MD  insulin aspart (NOVOLOG) 100 UNIT/ML injection Inject 4-8 Units into the skin 3 (three) times daily before meals. ssi    Historical Provider, MD  insulin detemir (LEVEMIR) 100 UNIT/ML injection Inject 20 Units into the skin daily. May take 17  units in the evening if cbg is high    Historical Provider, MD    HOSPITAL MEDICATIONS: I have reviewed the patient's current medications.  VITALS: There  were no vitals taken for this visit.  PHYSICAL EXAM: General appearance: alert, cooperative, cachectic and no distress Neck: no JVD Lungs: decreased at bases Heart: regular rate and rhythm Abdomen: soft, non-tender; bowel sounds normal; no masses,  no organomegaly Extremities: extremities normal, atraumatic, no cyanosis or edema Pulses: diminnished Skin: pale, cool, dry Neurologic: Grossly normal  LABS: Results for orders placed or performed during the hospital encounter of 12/21/15 (from the past 24 hour(s))  Basic metabolic panel     Status: Abnormal   Collection Time: 12/23/15  6:10 AM  Result Value Ref Range   Sodium 149 (H) 135 - 145 mmol/L   Potassium 3.5 3.5 - 5.1 mmol/L   Chloride 106 101 - 111 mmol/L   CO2 32 22 - 32 mmol/L   Glucose, Bld 233 (H) 65 - 99  mg/dL   BUN 35 (H) 6 - 20 mg/dL   Creatinine, Ser 3.64 0.44 - 1.00 mg/dL   Calcium 8.8 (L) 8.9 - 10.3 mg/dL   GFR calc non Af Amer >60 >60 mL/min   GFR calc Af Amer >60 >60 mL/min   Anion gap 11 5 - 15  Magnesium     Status: None   Collection Time: 12/23/15  6:10 AM  Result Value Ref Range   Magnesium 2.1 1.7 - 2.4 mg/dL  Phosphorus     Status: None   Collection Time: 12/23/15  6:10 AM  Result Value Ref Range   Phosphorus 3.0 2.5 - 4.6 mg/dL  CK total and CKMB (cardiac)not at [facility]     Status: Abnormal   Collection Time: 12/23/15  6:10 AM  Result Value Ref Range   Total CK 258 (H) 38 - 234 U/L   CK, MB 1.7 0.5 - 5.0 ng/mL   Relative Index 0.7 0.0 - 2.5  Troponin I     Status: Abnormal   Collection Time: 12/23/15  6:10 AM  Result Value Ref Range   Troponin I 3.29 (HH) <0.031 ng/mL  CBC     Status: Abnormal   Collection Time: 12/23/15  7:25 AM  Result Value Ref Range   WBC 9.9 4.0 - 10.5 K/uL   RBC 4.00 3.87 - 5.11 MIL/uL   Hemoglobin 10.6 (L) 12.0 - 15.0 g/dL   HCT 68.0 (L) 32.1 - 22.4 %   MCV 83.3 78.0 - 100.0 fL   MCH 26.5 26.0 - 34.0 pg   MCHC 31.8 30.0 - 36.0 g/dL   RDW 82.5 00.3 - 70.4 %    Platelets 274 150 - 400 K/uL  CK total and CKMB (cardiac)not at [facility]     Status: Abnormal   Collection Time: 12/23/15  8:27 AM  Result Value Ref Range   Total CK 235 (H) 38 - 234 U/L   CK, MB 2.3 0.5 - 5.0 ng/mL   Relative Index 1.0 0.0 - 2.5  Troponin I     Status: Abnormal   Collection Time: 12/23/15  8:27 AM  Result Value Ref Range   Troponin I 3.71 (HH) <0.031 ng/mL    EKG: NSR LBBB  IMAGING: Dg Chest Port 1 View  12/21/2015  CLINICAL DATA:  Respiratory failure.  Transfer. EXAM: PORTABLE CHEST 1 VIEW COMPARISON:  12/12/2007 FINDINGS: Endotracheal tube terminates 3.3 cm above carina.Nasogastric tube extends beyond the inferior aspect of the film. Left-sided PICC line terminates at the cavoatrial junction. Midline trachea. Normal heart size. Atherosclerosis in the transverse aorta. No pleural effusion or pneumothorax. Numerous leads and wires project over the chest. No congestive failure. No lobar consolidation. Mild subsegmental atelectasis at the lung bases. IMPRESSION: No acute cardiopulmonary disease. Aortic atherosclerosis. Appropriate position of support apparatus. Electronically Signed   By: Jeronimo Greaves M.D.   On: 12/21/2015 18:39   Dg Abd Portable 1v  12/21/2015  CLINICAL DATA:  Pt is a new admission after being transferred from a different facility. Check and verify NG tube placement as well as ETT. EXAM: PORTABLE ABDOMEN - 1 VIEW COMPARISON:  Chest radiograph same date FINDINGS: Nasogastric terminates at the distal stomach. Multiple leads and wires project over the upper abdomen. Probable cholecystectomy clips. Non-obstructive bowel gas pattern. Large amount of stool distally. Vascular calcifications. IMPRESSION: Nasogastric tube terminating at the distal stomach. Question constipation or even mild fecal impaction. Electronically Signed   By: Jeronimo Greaves M.D.   On: 12/21/2015 18:40  IMPRESSION: Principal Problem:   Chest pain with high risk of acute coronary syndrome Active  Problems:   CAD S/P LAD DES 12/16/15-[place]   Peripheral vascular disease, unspecified (HCC)   Cardiac arrest- post PCI 12/16/15   PSVT    RECOMMENDATION: Medical Rx- add nitrate, f/u Troponin. MD to see  Time Spent Directly with Patient: [address] minutes  Corine Shelter, [place]  [phone] beeper 12/23/2015, 2:16 PM   Patient seen, examined. Available data reviewed. Agree with findings, assessment, and plan as outlined by Corine Shelter, PA-C.   The patient was independently interviewed and examined. Her daughters are at the bedside. She is a frail-appearing 82 year old woman in no distress. She has been extubated since transfer here from [place]. I have reviewed records from [place]. I have reviewed her paper chart here including vital signs and hospital notes. The patient has a severe cardiomyopathy following large myocardial infarction. She has a chronic left bundle branch block but apparently had an acute MI requiring PCI complicated by cardiopulmonary arrest. There was consideration of tracheostomy but fortunately the patient has been able to be extubated from the ventilator.  She had some sharp chest pains last night, but these were fleeting. No recurrence after one sublingual nitroglycerin. She has no complaints at the time of my interview with her. On exam, she is a thin elderly woman in no distress. Lung fields are clear. Heart is regular rate and rhythm. Abdomen is soft and nontender. Groin sites are examined and there is no evidence of hematoma. There is no peripheral edema.  EKG shows left bundle branch block. Her troponin is flat to downward trending at 3.1. I think this is reflective of her previous infarct. CK-MBs are normal. Renal function is normal with a creatinine of 0.69.  I think ongoing medical therapy for post MI treatment is indicated. If she can tolerate carvedilol and an ACE inhibitor, these would be appropriate medications. An echocardiogram is ordered to reassess LV  function. We discussed her prognosis, and she appears to be relatively stable from a cardiac perspective at this time. She understands she'll have a long recovery after such a critical illness. Will follow along with you. I would not anticipate any further invasive cardiac testing at this time. Tonny Bollman, M.D. 12/23/2015 5:31 PM

## 2015-12-24 ENCOUNTER — Other Ambulatory Visit (HOSPITAL_COMMUNITY): Payer: Medicare Other

## 2015-12-24 DIAGNOSIS — I2510 Atherosclerotic heart disease of native coronary artery without angina pectoris: Secondary | ICD-10-CM

## 2015-12-24 DIAGNOSIS — I469 Cardiac arrest, cause unspecified: Secondary | ICD-10-CM

## 2015-12-24 DIAGNOSIS — Z9861 Coronary angioplasty status: Secondary | ICD-10-CM | POA: Diagnosis not present

## 2015-12-24 DIAGNOSIS — J9601 Acute respiratory failure with hypoxia: Secondary | ICD-10-CM | POA: Diagnosis not present

## 2015-12-24 LAB — BASIC METABOLIC PANEL
Anion gap: 13 (ref 5–15)
BUN: 20 mg/dL (ref 6–20)
CHLORIDE: 99 mmol/L — AB (ref 101–111)
CO2: 29 mmol/L (ref 22–32)
Calcium: 8.4 mg/dL — ABNORMAL LOW (ref 8.9–10.3)
Creatinine, Ser: 0.63 mg/dL (ref 0.44–1.00)
GFR calc non Af Amer: >60 mL/min (ref >60)
Glucose, Bld: 231 mg/dL — ABNORMAL HIGH (ref 65–99)
POTASSIUM: 3.9 mmol/L (ref 3.5–5.1)
SODIUM: 141 mmol/L (ref 135–145)

## 2015-12-24 LAB — TROPONIN I: TROPONIN I: 1.96 ng/mL — AB (ref <0.031)

## 2015-12-24 NOTE — Progress Notes (Signed)
   Name: [name] [name] MRN: [id number] DOB: 01/11/1931    ADMISSION DATE:  12/21/2015 CONSULTATION DATE:  2/6  REFERRING MD :  Hijazi  CHIEF COMPLAINT:  VDRF  HISTORY OF PRESENT ILLNESS:   82 yo female presented to [facility] 2/01 with chest pain and dyspnea from ACS.  She had lt heart cath with occlusion of Lt CX s/p DES.  She developed PEA arrest post-procedure.  She was found to have acute systolic CHF.  She was transferred to [facility] 2/06 for vent weaning.  She has hx of DM, HTN, PAD, and RA.  SIGNIFICANT EVENTS  2/1 STEMI, cardiac arrest in cath lab, intubated, admitted to ICU 2/4 Off pressors 2/6 transfer to [facility] 2/7 Extubated  SUBJECTIVE:  Denies chest pain, dyspnea.  VITAL SIGNS: T 97.6, HR 93, RR 22, BP 121/59, SpO2 98%  PHYSICAL EXAMINATION: General: pleasant HEENT: no stridor Cardiac: regular Chest: no wheeze Abd: soft, non tender Ext: no edema Neuro: follows commands   CMP Latest Ref Rng 12/24/2015 12/23/2015 12/22/2015  Glucose 65 - 99 mg/dL 239(R) 320(E) 334(D)  BUN 6 - 20 mg/dL 20 56(Y) 61(U)  Creatinine 0.44 - 1.00 mg/dL 8.37 2.90 2.11  Sodium 135 - 145 mmol/L 141 149(H) 146(H)  Potassium 3.5 - 5.1 mmol/L 3.9 3.5 3.1(L)  Chloride 101 - 111 mmol/L 99(L) 106 102  CO2 22 - 32 mmol/L 29 32 31  Calcium 8.9 - 10.3 mg/dL 1.5(Z) 2.0(E) 0.2(M)    CBC Latest Ref Rng 12/23/2015 12/22/2015 11/02/2015  WBC 4.0 - 10.5 K/uL 9.9 10.2 -  Hemoglobin 12.0 - 15.0 g/dL 10.6(L) 9.4(L) 12.2  Hematocrit 36.0 - 46.0 % 33.3(L) 29.4(L) 36.0  Platelets 150 - 400 K/uL 274 252 -    Dg Chest Port 1 View  12/21/2015  CLINICAL DATA:  Respiratory failure.  Transfer. EXAM: PORTABLE CHEST 1 VIEW COMPARISON:  12/12/2007 FINDINGS: Endotracheal tube terminates 3.3 cm above carina.Nasogastric tube extends beyond the inferior aspect of the film. Left-sided PICC line terminates at the cavoatrial junction. Midline trachea. Normal heart size. Atherosclerosis in the transverse aorta. No pleural  effusion or pneumothorax. Numerous leads and wires project over the chest. No congestive failure. No lobar consolidation. Mild subsegmental atelectasis at the lung bases. IMPRESSION: No acute cardiopulmonary disease. Aortic atherosclerosis. Appropriate position of support apparatus. Electronically Signed   By: Jeronimo Greaves M.D.   On: 12/21/2015 18:39   Dg Abd Portable 1v  12/21/2015  CLINICAL DATA:  Pt is a new admission after being transferred from a different facility. Check and verify NG tube placement as well as ETT. EXAM: PORTABLE ABDOMEN - 1 VIEW COMPARISON:  Chest radiograph same date FINDINGS: Nasogastric terminates at the distal stomach. Multiple leads and wires project over the upper abdomen. Probable cholecystectomy clips. Non-obstructive bowel gas pattern. Large amount of stool distally. Vascular calcifications. IMPRESSION: Nasogastric tube terminating at the distal stomach. Question constipation or even mild fecal impaction. Electronically Signed   By: Jeronimo Greaves M.D.   On: 12/21/2015 18:40    ASSESSMENT / PLAN:  Acute hypoxic respiratory failure after cardiac arrest in setting of ACS and acute systolic CHF >> successfully extubated 2/07. Plan: - bronchial hygiene  Updated pt's family at bedside.  PCCM will sign off.  Coralyn Helling, MD [facility] Pulmonary/Critical Care 12/24/2015, 12:16 PM Pager:  [phone] After 3pm call: [phone]

## 2015-12-25 ENCOUNTER — Other Ambulatory Visit (HOSPITAL_BASED_OUTPATIENT_CLINIC_OR_DEPARTMENT_OTHER): Payer: Medicare Other

## 2015-12-25 DIAGNOSIS — I509 Heart failure, unspecified: Secondary | ICD-10-CM | POA: Diagnosis not present

## 2015-12-25 NOTE — Progress Notes (Signed)
    Subjective:  No complaints. Eating dinner - family at bedside. No CP or dyspnea.   Objective:  Vital Signs in the last 24 hours:    Intake/Output from previous day:    Physical Exam: Pt is alert and oriented, elderly frail appearing woman in NAD HEENT: normal Neck: JVP - normal Lungs: CTA bilaterally CV: RRR without murmur or gallop, distant Abd: soft, NT Ext: mild edema Skin: warm/dry no rash   Lab Results:  Recent Labs  12/23/15 0725  WBC 9.9  HGB 10.6*  PLT 274    Recent Labs  12/23/15 0610 12/24/15 0840  NA 149* 141  K 3.5 3.9  CL 106 99*  CO2 32 29  GLUCOSE 233* 231*  BUN 35* 20  CREATININE 0.69 0.63    Recent Labs  12/23/15 2035 12/24/15 0840  TROPONINI 2.84* 1.96*    Cardiac Studies: 2D Echo pending  Tele: Sinus rhythm  Assessment/Plan:  1. Acute on chronic systolic heart failure: euvolemic on exam.  2. Acute MI complicated by cardiac arrest: LVEF 20%. Await follow-up echo done this afternoon. Tolerating low-dose carvedilol. Consider lisinopril 2.5 mg if BP stays > 100 mmHg. 3. Acute respiratory failure, resolved.  Appears stable from cardiac perspective. Would consider addition of low-dose ACE pending echo result and maintenance of SBP > 100 mmHg. She is very frail with protein calorie malnutrition and I don't think a statin will be well-tolerated. Continue ASA and plavix. Call if any further cardiac issues arise. Cecille Po, M.D. 12/25/2015, 5:32 PM

## 2015-12-25 NOTE — Progress Notes (Signed)
  Echocardiogram 2D Echocardiogram has been performed.  [name] [name] 12/25/2015, 5:08 PM

## 2015-12-27 LAB — CULTURE, BLOOD (ROUTINE X 2)
Culture: NO GROWTH
Culture: NO GROWTH

## 2015-12-28 LAB — CBC WITH DIFFERENTIAL/PLATELET
BASOS ABS: 0 10*3/uL (ref 0.0–0.1)
BASOS PCT: 0 %
EOS ABS: 0.2 10*3/uL (ref 0.0–0.7)
Eosinophils Relative: 2 %
HEMATOCRIT: 32.6 % — AB (ref 36.0–46.0)
HEMOGLOBIN: 10.6 g/dL — AB (ref 12.0–15.0)
Lymphocytes Relative: 14 %
Lymphs Abs: 1.5 10*3/uL (ref 0.7–4.0)
MCH: 26.2 pg (ref 26.0–34.0)
MCHC: 32.5 g/dL (ref 30.0–36.0)
MCV: 80.7 fL (ref 78.0–100.0)
MONOS PCT: 7 %
Monocytes Absolute: 0.7 10*3/uL (ref 0.1–1.0)
NEUTROS ABS: 8.2 10*3/uL — AB (ref 1.7–7.7)
NEUTROS PCT: 77 %
Platelets: 347 10*3/uL (ref 150–400)
RBC: 4.04 MIL/uL (ref 3.87–5.11)
RDW: 15.4 % (ref 11.5–15.5)
WBC: 10.6 10*3/uL — ABNORMAL HIGH (ref 4.0–10.5)

## 2015-12-28 LAB — BASIC METABOLIC PANEL
ANION GAP: 13 (ref 5–15)
BUN: 8 mg/dL (ref 6–20)
CALCIUM: 8.4 mg/dL — AB (ref 8.9–10.3)
CHLORIDE: 97 mmol/L — AB (ref 101–111)
CO2: 27 mmol/L (ref 22–32)
CREATININE: 0.51 mg/dL (ref 0.44–1.00)
GFR calc non Af Amer: >60 mL/min (ref >60)
Glucose, Bld: 171 mg/dL — ABNORMAL HIGH (ref 65–99)
Potassium: 3.6 mmol/L (ref 3.5–5.1)
SODIUM: 137 mmol/L (ref 135–145)

## 2015-12-28 LAB — CK TOTAL AND CKMB (NOT AT ARMC)
CK, MB: 2 ng/mL (ref 0.5–5.0)
Relative Index: INVALID (ref 0.0–2.5)
Total CK: 51 U/L (ref 38–234)

## 2015-12-28 LAB — TROPONIN I
TROPONIN I: 0.19 ng/mL — AB (ref <0.031)
Troponin I: 0.18 ng/mL — ABNORMAL HIGH (ref <0.031)

## 2015-12-28 LAB — MAGNESIUM: MAGNESIUM: 1.8 mg/dL (ref 1.7–2.4)

## 2015-12-28 LAB — PHOSPHORUS: PHOSPHORUS: 3.6 mg/dL (ref 2.5–4.6)

## 2015-12-30 LAB — BASIC METABOLIC PANEL
ANION GAP: 12 (ref 5–15)
BUN: 8 mg/dL (ref 6–20)
CO2: 27 mmol/L (ref 22–32)
Calcium: 8.8 mg/dL — ABNORMAL LOW (ref 8.9–10.3)
Chloride: 100 mmol/L — ABNORMAL LOW (ref 101–111)
Creatinine, Ser: 0.65 mg/dL (ref 0.44–1.00)
Glucose, Bld: 151 mg/dL — ABNORMAL HIGH (ref 65–99)
POTASSIUM: 4.4 mmol/L (ref 3.5–5.1)
SODIUM: 139 mmol/L (ref 135–145)

## 2015-12-30 LAB — CBC WITH DIFFERENTIAL/PLATELET
BASOS ABS: 0 10*3/uL (ref 0.0–0.1)
BASOS PCT: 0 %
EOS ABS: 0.2 10*3/uL (ref 0.0–0.7)
EOS PCT: 2 %
HCT: 34.3 % — ABNORMAL LOW (ref 36.0–46.0)
Hemoglobin: 11.1 g/dL — ABNORMAL LOW (ref 12.0–15.0)
Lymphocytes Relative: 18 %
Lymphs Abs: 1.9 10*3/uL (ref 0.7–4.0)
MCH: 26.5 pg (ref 26.0–34.0)
MCHC: 32.4 g/dL (ref 30.0–36.0)
MCV: 81.9 fL (ref 78.0–100.0)
Monocytes Absolute: 0.9 10*3/uL (ref 0.1–1.0)
Monocytes Relative: 9 %
Neutro Abs: 7.8 10*3/uL — ABNORMAL HIGH (ref 1.7–7.7)
Neutrophils Relative %: 71 %
PLATELETS: 429 10*3/uL — AB (ref 150–400)
RBC: 4.19 MIL/uL (ref 3.87–5.11)
RDW: 15.6 % — ABNORMAL HIGH (ref 11.5–15.5)
WBC: 10.9 10*3/uL — AB (ref 4.0–10.5)

## 2015-12-30 LAB — CK TOTAL AND CKMB (NOT AT ARMC)
CK TOTAL: 37 U/L — AB (ref 38–234)
CK, MB: 1.7 ng/mL (ref 0.5–5.0)
Relative Index: INVALID (ref 0.0–2.5)

## 2015-12-30 LAB — TROPONIN I: TROPONIN I: 0.1 ng/mL — AB (ref <0.031)

## 2016-02-03 ENCOUNTER — Ambulatory Visit: Payer: Medicare Other | Admitting: Family

## 2016-02-13 DIAGNOSIS — J189 Pneumonia, unspecified organism: Secondary | ICD-10-CM

## 2016-02-13 HISTORY — DX: Pneumonia, unspecified organism: J18.9

## 2016-02-24 ENCOUNTER — Encounter: Payer: Self-pay | Admitting: Vascular Surgery

## 2016-03-02 ENCOUNTER — Ambulatory Visit: Payer: Medicare Other | Admitting: Vascular Surgery

## 2016-03-09 ENCOUNTER — Telehealth: Payer: Self-pay

## 2016-03-09 NOTE — Telephone Encounter (Signed)
Daughter called to report that the pt. is at a rehab facility in [place], [place].  Reported that her left great toe has gangrene.  Stated the [facility] is planning to send the pt. to the local hospital.  Reported the "BP is low, has low grade fever, and is weak."  Stated she is aware that her mother may be facing an amputation of the toe.  Voiced desire to keep the pt's care with Dr. Edilia Bo, since he is familiar with her case.  Questioned how the pt. can get admitted to [facility]?  Advised that the pt. could go through the [facility] ER, or after being evaluated at the [facility], if it is necessary to admit her, the daughter can request to have her transferred to [facility].  Daughter verbalized understanding.

## 2016-03-16 ENCOUNTER — Encounter (HOSPITAL_COMMUNITY): Payer: Self-pay | Admitting: Emergency Medicine

## 2016-03-16 ENCOUNTER — Ambulatory Visit: Payer: Medicare Other | Admitting: Vascular Surgery

## 2016-03-16 ENCOUNTER — Emergency Department (HOSPITAL_COMMUNITY): Payer: Medicare Other

## 2016-03-16 ENCOUNTER — Inpatient Hospital Stay (HOSPITAL_COMMUNITY)
Admission: EM | Admit: 2016-03-16 | Discharge: 2016-03-18 | DRG: 300 | Disposition: A | Discharge status: Skilled Nursing Facility | Payer: Medicare Other | Attending: Internal Medicine | Admitting: Internal Medicine

## 2016-03-16 DIAGNOSIS — Z681 Body mass index (BMI) 19 or less, adult: Secondary | ICD-10-CM

## 2016-03-16 DIAGNOSIS — E1152 Type 2 diabetes mellitus with diabetic peripheral angiopathy with gangrene: Secondary | ICD-10-CM | POA: Diagnosis present

## 2016-03-16 DIAGNOSIS — I70262 Atherosclerosis of native arteries of extremities with gangrene, left leg: Secondary | ICD-10-CM | POA: Diagnosis not present

## 2016-03-16 DIAGNOSIS — I252 Old myocardial infarction: Secondary | ICD-10-CM

## 2016-03-16 DIAGNOSIS — I2510 Atherosclerotic heart disease of native coronary artery without angina pectoris: Secondary | ICD-10-CM | POA: Diagnosis present

## 2016-03-16 DIAGNOSIS — Z794 Long term (current) use of insulin: Secondary | ICD-10-CM | POA: Diagnosis not present

## 2016-03-16 DIAGNOSIS — E1169 Type 2 diabetes mellitus with other specified complication: Secondary | ICD-10-CM

## 2016-03-16 DIAGNOSIS — Z7982 Long term (current) use of aspirin: Secondary | ICD-10-CM

## 2016-03-16 DIAGNOSIS — I469 Cardiac arrest, cause unspecified: Secondary | ICD-10-CM | POA: Diagnosis present

## 2016-03-16 DIAGNOSIS — I739 Peripheral vascular disease, unspecified: Secondary | ICD-10-CM | POA: Diagnosis not present

## 2016-03-16 DIAGNOSIS — I96 Gangrene, not elsewhere classified: Secondary | ICD-10-CM

## 2016-03-16 DIAGNOSIS — E119 Type 2 diabetes mellitus without complications: Secondary | ICD-10-CM

## 2016-03-16 DIAGNOSIS — M069 Rheumatoid arthritis, unspecified: Secondary | ICD-10-CM | POA: Diagnosis present

## 2016-03-16 DIAGNOSIS — I471 Supraventricular tachycardia: Secondary | ICD-10-CM | POA: Diagnosis present

## 2016-03-16 DIAGNOSIS — Z8674 Personal history of sudden cardiac arrest: Secondary | ICD-10-CM

## 2016-03-16 DIAGNOSIS — Z833 Family history of diabetes mellitus: Secondary | ICD-10-CM

## 2016-03-16 DIAGNOSIS — D649 Anemia, unspecified: Secondary | ICD-10-CM | POA: Diagnosis present

## 2016-03-16 DIAGNOSIS — E440 Moderate protein-calorie malnutrition: Secondary | ICD-10-CM | POA: Diagnosis present

## 2016-03-16 DIAGNOSIS — L089 Local infection of the skin and subcutaneous tissue, unspecified: Secondary | ICD-10-CM | POA: Diagnosis not present

## 2016-03-16 DIAGNOSIS — Z8673 Personal history of transient ischemic attack (TIA), and cerebral infarction without residual deficits: Secondary | ICD-10-CM

## 2016-03-16 DIAGNOSIS — F0150 Vascular dementia without behavioral disturbance: Secondary | ICD-10-CM | POA: Diagnosis present

## 2016-03-16 DIAGNOSIS — Z955 Presence of coronary angioplasty implant and graft: Secondary | ICD-10-CM

## 2016-03-16 DIAGNOSIS — Z79899 Other long term (current) drug therapy: Secondary | ICD-10-CM | POA: Diagnosis not present

## 2016-03-16 DIAGNOSIS — E11621 Type 2 diabetes mellitus with foot ulcer: Secondary | ICD-10-CM

## 2016-03-16 DIAGNOSIS — I10 Essential (primary) hypertension: Secondary | ICD-10-CM | POA: Diagnosis present

## 2016-03-16 DIAGNOSIS — E11628 Type 2 diabetes mellitus with other skin complications: Secondary | ICD-10-CM | POA: Diagnosis present

## 2016-03-16 DIAGNOSIS — Z9861 Coronary angioplasty status: Secondary | ICD-10-CM

## 2016-03-16 DIAGNOSIS — M79672 Pain in left foot: Secondary | ICD-10-CM | POA: Diagnosis present

## 2016-03-16 HISTORY — DX: Type 2 diabetes mellitus with foot ulcer: E11.621

## 2016-03-16 HISTORY — DX: Unspecified asthma, uncomplicated: J45.909

## 2016-03-16 HISTORY — DX: Type 2 diabetes mellitus without complications: E11.9

## 2016-03-16 HISTORY — DX: Heart failure, unspecified: I50.9

## 2016-03-16 HISTORY — DX: Other complications of anesthesia, initial encounter: T88.59XA

## 2016-03-16 HISTORY — DX: Adverse effect of unspecified anesthetic, initial encounter: T41.45XA

## 2016-03-16 HISTORY — DX: Non-pressure chronic ulcer of other part of unspecified foot with unspecified severity: L97.509

## 2016-03-16 HISTORY — DX: Pneumonia, unspecified organism: J18.9

## 2016-03-16 LAB — CBC
HCT: 28.8 % — ABNORMAL LOW (ref 36.0–46.0)
HEMOGLOBIN: 9.8 g/dL — AB (ref 12.0–15.0)
MCH: 27.5 pg (ref 26.0–34.0)
MCHC: 34 g/dL (ref 30.0–36.0)
MCV: 80.9 fL (ref 78.0–100.0)
PLATELETS: 401 10*3/uL — AB (ref 150–400)
RBC: 3.56 MIL/uL — ABNORMAL LOW (ref 3.87–5.11)
RDW: 15.4 % (ref 11.5–15.5)
WBC: 10.8 10*3/uL — ABNORMAL HIGH (ref 4.0–10.5)

## 2016-03-16 LAB — COMPREHENSIVE METABOLIC PANEL
ALBUMIN: 2.7 g/dL — AB (ref 3.5–5.0)
ALK PHOS: 80 U/L (ref 38–126)
ALT: 27 U/L (ref 14–54)
ANION GAP: 12 (ref 5–15)
AST: 26 U/L (ref 15–41)
BILIRUBIN TOTAL: 0.4 mg/dL (ref 0.3–1.2)
BUN: 17 mg/dL (ref 6–20)
CALCIUM: 9.3 mg/dL (ref 8.9–10.3)
CO2: 21 mmol/L — ABNORMAL LOW (ref 22–32)
CREATININE: 0.8 mg/dL (ref 0.44–1.00)
Chloride: 104 mmol/L (ref 101–111)
GFR calc Af Amer: >60 mL/min (ref >60)
GFR calc non Af Amer: >60 mL/min (ref >60)
GLUCOSE: 176 mg/dL — AB (ref 65–99)
Potassium: 4.1 mmol/L (ref 3.5–5.1)
Sodium: 137 mmol/L (ref 135–145)
TOTAL PROTEIN: 7.5 g/dL (ref 6.5–8.1)

## 2016-03-16 LAB — I-STAT CG4 LACTIC ACID, ED: LACTIC ACID, VENOUS: 1.87 mmol/L (ref 0.5–2.0)

## 2016-03-16 LAB — PHOSPHORUS: Phosphorus: 3.1 mg/dL (ref 2.5–4.6)

## 2016-03-16 LAB — DIGOXIN LEVEL

## 2016-03-16 LAB — MAGNESIUM: Magnesium: 2.2 mg/dL (ref 1.7–2.4)

## 2016-03-16 LAB — GLUCOSE, CAPILLARY: GLUCOSE-CAPILLARY: 137 mg/dL — AB (ref 65–99)

## 2016-03-16 MED ORDER — ONDANSETRON HCL 4 MG PO TABS: 4.0000 mg | ORAL_TABLET | Freq: Four times a day (QID) | ORAL | Status: DC | PRN

## 2016-03-16 MED ORDER — ENALAPRIL MALEATE 5 MG PO TABS
5.0000 mg | ORAL_TABLET | Freq: Every day | ORAL | Status: DC
  Administered 2016-03-17 – 2016-03-18 (×2): 5 mg via ORAL
  Filled 2016-03-16 (×2): qty 1

## 2016-03-16 MED ORDER — METRONIDAZOLE 500 MG PO TABS
500.0000 mg | ORAL_TABLET | Freq: Three times a day (TID) | ORAL | Status: DC
Start: 2016-03-16 — End: 2016-03-18
  Administered 2016-03-16 – 2016-03-18 (×6): 500 mg via ORAL
  Filled 2016-03-16 (×6): qty 1

## 2016-03-16 MED ORDER — VANCOMYCIN HCL IN DEXTROSE 1-5 GM/200ML-% IV SOLN
1000.0000 mg | Freq: Once | INTRAVENOUS | Status: AC
  Administered 2016-03-16: 1000 mg via INTRAVENOUS
  Filled 2016-03-16: qty 200

## 2016-03-16 MED ORDER — DIGOXIN 125 MCG PO TABS
250.0000 ug | ORAL_TABLET | Freq: Every day | ORAL | Status: DC
  Administered 2016-03-17 – 2016-03-18 (×2): 250 ug via ORAL
  Filled 2016-03-16 (×2): qty 2

## 2016-03-16 MED ORDER — ACETAMINOPHEN 650 MG RE SUPP: 650.0000 mg | Freq: Four times a day (QID) | RECTAL | Status: DC | PRN

## 2016-03-16 MED ORDER — ASPIRIN 81 MG PO CHEW
81.0000 mg | CHEWABLE_TABLET | Freq: Every day | ORAL | Status: DC
  Administered 2016-03-17 – 2016-03-18 (×2): 81 mg via ORAL
  Filled 2016-03-16 (×2): qty 1

## 2016-03-16 MED ORDER — INSULIN ASPART 100 UNIT/ML ~~LOC~~ SOLN
0.0000 [IU] | Freq: Three times a day (TID) | SUBCUTANEOUS | Status: DC
  Administered 2016-03-17 (×2): 1 [IU] via SUBCUTANEOUS
  Administered 2016-03-18: 2 [IU] via SUBCUTANEOUS

## 2016-03-16 MED ORDER — ACETAMINOPHEN 325 MG PO TABS
650.0000 mg | ORAL_TABLET | Freq: Four times a day (QID) | ORAL | Status: DC | PRN
  Administered 2016-03-16 – 2016-03-18 (×4): 650 mg via ORAL
  Filled 2016-03-16 (×4): qty 2

## 2016-03-16 MED ORDER — INSULIN DETEMIR 100 UNIT/ML ~~LOC~~ SOLN
20.0000 [IU] | Freq: Every day | SUBCUTANEOUS | Status: DC
Start: 2016-03-17 — End: 2016-03-17
  Filled 2016-03-16: qty 0.2

## 2016-03-16 MED ORDER — VANCOMYCIN HCL IN DEXTROSE 750-5 MG/150ML-% IV SOLN
750.0000 mg | INTRAVENOUS | Status: DC
  Administered 2016-03-17: 750 mg via INTRAVENOUS
  Filled 2016-03-16 (×2): qty 150

## 2016-03-16 MED ORDER — DEXTROSE 5 % IV SOLN
1.0000 g | Freq: Three times a day (TID) | INTRAVENOUS | Status: DC
  Administered 2016-03-16 – 2016-03-18 (×6): 1 g via INTRAVENOUS
  Filled 2016-03-16 (×7): qty 1

## 2016-03-16 MED ORDER — ONDANSETRON HCL 4 MG/2ML IJ SOLN: 4.0000 mg | Freq: Four times a day (QID) | INTRAMUSCULAR | Status: DC | PRN

## 2016-03-16 MED ORDER — ENOXAPARIN SODIUM 40 MG/0.4ML ~~LOC~~ SOLN
40.0000 mg | Freq: Every day | SUBCUTANEOUS | Status: DC
  Administered 2016-03-16 – 2016-03-17 (×2): 40 mg via SUBCUTANEOUS
  Filled 2016-03-16 (×2): qty 0.4

## 2016-03-16 MED ORDER — DEXTROSE 5 % IV SOLN
1.0000 g | Freq: Three times a day (TID) | INTRAVENOUS | Status: DC
  Filled 2016-03-16 (×2): qty 1

## 2016-03-16 MED ORDER — FUROSEMIDE 40 MG PO TABS: 40.0000 mg | ORAL_TABLET | Freq: Every day | ORAL | Status: DC | PRN

## 2016-03-16 NOTE — ED Provider Notes (Signed)
CSN: [id number]     Arrival date & time 03/16/16  1708 History   First MD Initiated Contact with Patient 03/16/16 1756     Chief Complaint  Patient presents with  . Foot Pain     (Consider location/radiation/quality/duration/timing/severity/associated sxs/prior Treatment) Patient is a 82 y.o. female presenting with lower extremity pain. The history is provided by a relative. No language interpreter was used.  Foot Pain This is a recurrent problem. The current episode started more than 1 month ago. The problem occurs constantly. The problem has been rapidly worsening. Associated symptoms include numbness. Pertinent negatives include no vomiting or weakness. Nothing aggravates the symptoms. She has tried nothing for the symptoms. The treatment provided moderate relief.  Pt has a black toe on her left foot.  Pt is followed by Dr. Edilia Bo Vascular surgery.   Pt saw MD today and was sent here due to increasing infection.   Family reports they were told they would see Dr. Konrad Dolores.   Pt is diabetic Past Medical History  Diagnosis Date  . Diabetes mellitus   . Hypertension   . Arthritis   . RA (rheumatoid arthritis) (HCC)   . Peripheral vascular disease (HCC)   . Anemia   . Irregular heart beat   . Coronary artery disease   . Stroke [facility]) 2008  . Myocardial infarction [facility]) 2008   Past Surgical History  Procedure Laterality Date  . Appendectomy    . Tubal ligation    . Cholecystectomy      Gall Bladder  . Pr vein bypass graft,aorto-fem-pop  12/27/2007    Right Femoral-tibial BPG by Dr. Madilyn Fireman  . Peripheral vascular catheterization N/A 11/02/2015    Procedure: Abdominal Aortogram;  Surgeon: Chuck Hint, MD;  Location: [facility] INVASIVE CV LAB;  Service: Cardiovascular;  Laterality: N/A;  . Lower extremity angiogram Bilateral 11/02/2015    Procedure: Lower Extremity Angiogram;  Surgeon: Chuck Hint, MD;  Location: [facility] INVASIVE CV LAB;  Service: Cardiovascular;  Laterality:  Bilateral;   Family History  Problem Relation Age of Onset  . Cancer Sister     breast cancer  . Diabetes Sister   . Hypertension Sister   . Varicose Veins Sister   . Heart attack Sister   . Peripheral vascular disease Sister   . Diabetes Mother   . Heart disease Mother     Before age 59  . Hypertension Mother   . Diabetes Father   . Deep vein thrombosis Father   . Heart disease Father     Before age 56  . Hypertension Father   . Heart attack Father   . Cancer Brother   . Diabetes Brother   . Heart disease Brother     Before age 20  . Heart attack Brother   . Diabetes Daughter   . Hyperlipidemia Daughter   . Cancer Daughter     Blood  . Hypertension Daughter   . Varicose Veins Daughter   . Deep vein thrombosis Son     Bleeding problems   Social History  Substance Use Topics  . Smoking status: Never Smoker   . Smokeless tobacco: Never Used  . Alcohol Use: No   OB History    No data available     Review of Systems  Gastrointestinal: Negative for vomiting.  Neurological: Positive for numbness. Negative for weakness.  All other systems reviewed and are negative.     Allergies  Ciprofloxacin; Erythromycin; Macrolides and ketolides; Nitrofurantoin; Penicillins; and Sulfa antibiotics  Home Medications   Prior to Admission medications   Medication Sig Start Date End Date Taking? Authorizing Provider  aspirin 81 MG tablet Take 81 mg by mouth daily.    Historical Provider, MD  digoxin (LANOXIN) 0.25 MG tablet Take 250 mcg by mouth daily.    Historical Provider, MD  enalapril (VASOTEC) 5 MG tablet Take 5 mg by mouth daily.    Historical Provider, MD  furosemide (LASIX) 40 MG tablet Take 40 mg by mouth as needed. For swelling    Historical Provider, MD  insulin aspart (NOVOLOG) 100 UNIT/ML injection Inject 4-8 Units into the skin 3 (three) times daily before meals. ssi    Historical Provider, MD  insulin detemir (LEVEMIR) 100 UNIT/ML injection Inject 20 Units into  the skin daily. May take 17  units in the evening if cbg is high    Historical Provider, MD   BP 125/72 mmHg  Pulse 103  Temp(Src) 98.5 F (36.9 C) (Oral)  Resp 18  SpO2 98% Physical Exam  Constitutional: She is oriented to person, place, and time. She appears well-developed and well-nourished.  HENT:  Head: Normocephalic.  Eyes: EOM are normal.  Neck: Normal range of motion.  Pulmonary/Chest: Effort normal.  Abdominal: She exhibits no distension.  Musculoskeletal:  Left toe black to base, large eschar,  Erythema to lower aspect of foot, necrotic odor.   Neurological: She is alert and oriented to person, place, and time.  Psychiatric: She has a normal mood and affect.  Nursing note and vitals reviewed.   ED Course  Procedures (including critical care time) Labs Review Labs Reviewed  COMPREHENSIVE METABOLIC PANEL - Abnormal; Notable for the following:    CO2 21 (*)    Glucose, Bld 176 (*)    Albumin 2.7 (*)    All other components within normal limits  CBC - Abnormal; Notable for the following:    WBC 10.8 (*)    RBC 3.56 (*)    Hemoglobin 9.8 (*)    HCT 28.8 (*)    Platelets 401 (*)    All other components within normal limits  I-STAT CG4 LACTIC ACID, ED    Imaging Review No results found. I have personally reviewed and evaluated these images and lab results as part of my medical decision-making.   EKG Interpretation   Date/Time:  Wednesday Mar 16 2016 17:49:18 EDT Ventricular Rate:  107 PR Interval:  140 QRS Duration: 108 QT Interval:  372 QTC Calculation: 496 R Axis:   -29 Text Interpretation:  Sinus tachycardia Septal infarct , age undetermined  ST \\T \ T wave abnormality, consider lateral ischemia Abnormal ECG agree.  no change from old Confirmed by Donnald Garre, MD, Lebron Conners [phone]) on 03/16/2016  5:56:27 PM      MDM   Final diagnoses:  Gangrene of toe (HCC)    Consult to Hospitalist to admit.     Elson Areas, PA-C 03/16/16 1852  Lonia Skinner  [place], PA-C 03/16/16 1853  Arby Barrette, MD 03/24/16 1000

## 2016-03-16 NOTE — ED Notes (Signed)
Pt here with family from rehab center c/o left great wound that is dark in color and with foul odor

## 2016-03-16 NOTE — ED Notes (Signed)
Pt started having chest pain and took back for EKG

## 2016-03-16 NOTE — Progress Notes (Addendum)
Pharmacy Antibiotic Note [name] [name] is a 82 y.o. female admitted on 03/16/2016 with concern for diabetic foot infection. Pharmacy consulted for Azactam dosing in setting PCN allergy.   .  Plan: 1. Azactam 1 gram every 8 hours 2. Vancomycin 1000 mg x 1 now followed by vancomycin 750 mg q 24 hours starting on 5/4 pm 3. SCr q 72H while on vancomycin  4. Await cx data and narrow abx as feasible     Temp (24hrs), Avg:98.4 F (36.9 C), Min:98.2 F (36.8 C), Max:98.5 F (36.9 C)   Recent Labs Lab 03/16/16 1724 03/16/16 1737  WBC 10.8*  --   CREATININE 0.80  --   LATICACIDVEN  --  1.87    CrCl cannot be calculated (Unknown ideal weight.).    Allergies  Allergen Reactions  . Ciprofloxacin Shortness Of Breath and Swelling    Throat swelling   . Erythromycin Hives, Shortness Of Breath, Nausea And Vomiting, Swelling and Rash    Throat swelling   . Macrolides And Ketolides Hives, Shortness Of Breath, Nausea And Vomiting, Swelling and Rash    Throat swelling   . Nitrofurantoin Shortness Of Breath and Swelling    Throat swelling   . Penicillins Hives, Nausea And Vomiting and Rash    Has patient had a PCN reaction causing immediate rash, facial/tongue/throat swelling, SOB or lightheadedness with hypotension: Yes Has patient had a PCN reaction causing severe rash involving mucus membranes or skin necrosis: No Has patient had a PCN reaction that required hospitalization No Has patient had a PCN reaction occurring within the last 10 years: No If all of the above answers are "NO", then may proceed with Cephalosporin use. Has patient had a PCN reaction causing immediate rash, facial/tongue/throat swelling, SOB or lightheadedness with hypotension: Yes Has patient had a PCN reaction causing severe rash involving mucus membranes or skin necrosis: No Has patient had a PCN reaction that required hospitalization No Has patient had a PCN reaction occurring within the last 10 years: No If all  of the above answers are "NO", then may proceed with Cephalosporin use.   . Sulfa Antibiotics Shortness Of Breath and Swelling    Throat swelling     Antimicrobials this admission: 5/2  Azactam >>  5/2 Flagyl  >>  5/2 Vancomycin >>  Dose adjustments this admission: n/a  Microbiology results: Px  Thank you for allowing pharmacy to be a part of this patient's care.  Sheron Nightingale 03/16/2016 8:43 PM

## 2016-03-16 NOTE — ED Notes (Signed)
Admitting MD at bedside, provided applesauce per MD.

## 2016-03-16 NOTE — H&P (Signed)
History and Physical    [name] [name] DVV:[id number] DOB: 10-07-1928 DOA: 03/16/2016  Referring MD/NP/PA: Ok Edwards, PA-C PCP: Joaquin Courts, DO  Outpatient Specialists: Dr. Edilia Bo Vascular surgery. Patient coming from: Nursing home  Chief Complaint: Foot pain.  HPI: [name] [name] is a 82 y.o. female with a past medical history of type 2 diabetes, hypertension, rheumatoid arthritis, peripheral vascular disease, anemia, irregular heartbeat, CAD,S/P MI in February this year, CVA was brought from [place] nursing home facility due to having left foot pain.  Per patient's daughters, about a month ago the patient had a small lesion on her left toe, which has worsened significantly in the past 2 weeks to the point that has become very tender and his color has changed to black. Per patient's daughters, she sees her vascular surgeon, Dr. Edilia Bo, who was stated on the last visit that she has very poor circulation of the left lower extremity.   ED Course: Workup in the ER showed anemia, mild leukocytosis, a foot x-ray without any bone lesions or bony destruction.  Review of Systems: Unable to evaluate due to vascular dementia  Past Medical History  Diagnosis Date  . Diabetes mellitus   . Hypertension   . Arthritis   . RA (rheumatoid arthritis) (HCC)   . Peripheral vascular disease (HCC)   . Anemia   . Irregular heart beat   . Coronary artery disease   . Stroke [facility]) 2008  . Myocardial infarction [facility]) 2008    Past Surgical History  Procedure Laterality Date  . Appendectomy    . Tubal ligation    . Cholecystectomy      Gall Bladder  . Pr vein bypass graft,aorto-fem-pop  12/27/2007    Right Femoral-tibial BPG by Dr. Madilyn Fireman  . Peripheral vascular catheterization N/A 11/02/2015    Procedure: Abdominal Aortogram;  Surgeon: Chuck Hint, MD;  Location: [facility] INVASIVE CV LAB;  Service: Cardiovascular;  Laterality: N/A;  . Lower extremity angiogram Bilateral 11/02/2015    Procedure: Lower Extremity Angiogram;  Surgeon: Chuck Hint, MD;  Location: [facility] INVASIVE CV LAB;  Service: Cardiovascular;  Laterality: Bilateral;     reports that she has never smoked. She has never used smokeless tobacco. She reports that she does not drink alcohol or use illicit drugs.  Allergies  Allergen Reactions  . Ciprofloxacin Shortness Of Breath and Swelling    Throat swelling   . Erythromycin Hives, Shortness Of Breath, Nausea And Vomiting, Swelling and Rash    Throat swelling   . Macrolides And Ketolides Hives, Shortness Of Breath, Nausea And Vomiting, Swelling and Rash    Throat swelling   . Nitrofurantoin Shortness Of Breath and Swelling    Throat swelling   . Penicillins Hives, Nausea And Vomiting and Rash    Has patient had a PCN reaction causing immediate rash, facial/tongue/throat swelling, SOB or lightheadedness with hypotension: Yes Has patient had a PCN reaction causing severe rash involving mucus membranes or skin necrosis: No Has patient had a PCN reaction that required hospitalization No Has patient had a PCN reaction occurring within the last 10 years: No If all of the above answers are "NO", then may proceed with Cephalosporin use. Has patient had a PCN reaction causing immediate rash, facial/tongue/throat swelling, SOB or lightheadedness with hypotension: Yes Has patient had a PCN reaction causing severe rash involving mucus membranes or skin necrosis: No Has patient had a PCN reaction that required hospitalization No Has patient had a PCN reaction occurring within the last  10 years: No If all of the above answers are "NO", then may proceed with Cephalosporin use.   . Sulfa Antibiotics Shortness Of Breath and Swelling    Throat swelling     Family History  Problem Relation Age of Onset  . Cancer Sister     breast cancer  . Diabetes Sister   . Hypertension Sister   . Varicose Veins Sister   . Heart attack Sister   . Peripheral vascular  disease Sister   . Diabetes Mother   . Heart disease Mother     Before age 14  . Hypertension Mother   . Diabetes Father   . Deep vein thrombosis Father   . Heart disease Father     Before age 58  . Hypertension Father   . Heart attack Father   . Cancer Brother   . Diabetes Brother   . Heart disease Brother     Before age 38  . Heart attack Brother   . Diabetes Daughter   . Hyperlipidemia Daughter   . Cancer Daughter     Blood  . Hypertension Daughter   . Varicose Veins Daughter   . Deep vein thrombosis Son     Bleeding problems     Prior to Admission medications   Medication Sig Start Date End Date Taking? Authorizing Provider  aspirin 81 MG tablet Take 81 mg by mouth daily.    Historical Provider, MD  digoxin (LANOXIN) 0.25 MG tablet Take 250 mcg by mouth daily.    Historical Provider, MD  enalapril (VASOTEC) 5 MG tablet Take 5 mg by mouth daily.    Historical Provider, MD  furosemide (LASIX) 40 MG tablet Take 40 mg by mouth as needed. For swelling    Historical Provider, MD  insulin aspart (NOVOLOG) 100 UNIT/ML injection Inject 4-8 Units into the skin 3 (three) times daily before meals. ssi    Historical Provider, MD  insulin detemir (LEVEMIR) 100 UNIT/ML injection Inject 20 Units into the skin daily. May take 17  units in the evening if cbg is high    Historical Provider, MD    Physical Exam: Filed Vitals:   03/16/16 1716 03/16/16 1815 03/16/16 1830 03/16/16 2023  BP: 125/72 119/68 104/62 96/53  Pulse: 103 99 94 96  Temp: 98.5 F (36.9 C)   98.2 F (36.8 C)  TempSrc: Oral   Oral  Resp: 18 21 21 18   SpO2: 98% 99% 99% 99%      Constitutional: NAD, calm, comfortable Filed Vitals:   03/16/16 1716 03/16/16 1815 03/16/16 1830 03/16/16 2023  BP: 125/72 119/68 104/62 96/53  Pulse: 103 99 94 96  Temp: 98.5 F (36.9 C)   98.2 F (36.8 C)  TempSrc: Oral   Oral  Resp: 18 21 21 18   SpO2: 98% 99% 99% 99%   Eyes: PERRL, lids and conjunctivae normal ENMT: Mucous  membranes are moist. Posterior pharynx clear of any exudate or lesions. Absent dentition.  Neck: normal, supple, no masses, no thyromegaly Respiratory: clear to auscultation bilaterally, no wheezing, no crackles. Normal respiratory effort. No accessory muscle use.  Cardiovascular: Regular rate and rhythm, no murmurs / rubs / gallops. No extremity edema. 2+ pedal pulses. No carotid bruits.  Abdomen: no tenderness, no masses palpated. No hepatosplenomegaly. Bowel sounds positive.  Musculoskeletal: no clubbing / cyanosis. Left big toe dry gangrene, global muscular weakness and decreased muscular tone. Skin: Left big toe dry gangrene. Neurologic: Moves all extremities, but unable to fully evaluate due to  mental status. Psychiatric: Confused.    Labs on Admission: I have personally reviewed following labs and imaging studies  CBC:  Recent Labs Lab 03/16/16 1724  WBC 10.8*  HGB 9.8*  HCT 28.8*  MCV 80.9  PLT 401*   Basic Metabolic Panel:  Recent Labs Lab 03/16/16 1724 03/16/16 2045  NA 137  --   K 4.1  --   CL 104  --   CO2 21*  --   GLUCOSE 176*  --   BUN 17  --   CREATININE 0.80  --   CALCIUM 9.3  --   MG  --  2.2  PHOS  --  3.1   GFR: CrCl cannot be calculated (Unknown ideal weight.). Liver Function Tests:  Recent Labs Lab 03/16/16 1724  AST 26  ALT 27  ALKPHOS 80  BILITOT 0.4  PROT 7.5  ALBUMIN 2.7*   CBG:  Recent Labs Lab 03/16/16 2243  GLUCAP 137*   Urine analysis:    Component Value Date/Time   COLORURINE YELLOW 02/24/2012 1857   APPEARANCEUR CLEAR 02/24/2012 1857   LABSPEC 1.008 02/24/2012 1857   PHURINE 6.0 02/24/2012 1857   GLUCOSEU 100* 02/24/2012 1857   HGBUR NEGATIVE 02/24/2012 1857   BILIRUBINUR NEGATIVE 02/24/2012 1857   KETONESUR NEGATIVE 02/24/2012 1857   PROTEINUR NEGATIVE 02/24/2012 1857   UROBILINOGEN 0.2 02/24/2012 1857   NITRITE NEGATIVE 02/24/2012 1857   LEUKOCYTESUR SMALL* 02/24/2012 1857     Radiological Exams on  Admission: Dg Foot Complete Left  03/16/2016  CLINICAL DATA:  Left foot pain.  Discolored toes. EXAM: LEFT FOOT - COMPLETE 3+ VIEW COMPARISON:  None. FINDINGS: Negative for acute fracture, dislocation or radiopaque foreign body. There is no bone lesion or bony destruction. No soft tissue gas. IMPRESSION: Negative. Electronically Signed   By: Ellery Plunk M.D.   On: 03/16/2016 19:05    EKG: Independently reviewed. Vent. rate 107 BPM PR interval 140 ms QRS duration 108 ms QT/QTc 372/496 ms P-R-T axes 92 -29 113 Sinus tachycardia Septal infarct , age undetermined ST & T wave abnormality, consider lateral ischemia Abnormal ECG  Assessment/Plan Principal Problem:   Diabetic foot infection (HCC)   Peripheral vascular disease, unspecified (HCC) Admit to MedSurg/inpatient. Analgesics as needed. Start broad-spectrum IV antibiotics. Vascular studies of the left lower extremities in a.m. Consult Dr. Durwin Nora in a.m.  Active Problems:   Diabetes mellitus, type 2 (HCC) Carbohydrate modified diet. CBG monitoring with regular insulin sliding scale.    CAD S/P LAD DES 12/16/15-[place]   Cardiac arrest- post PCI 12/16/15 Continue aspirin and ACE inhibitor.    PSVT  Check Digoxin level.    Essential hypertension Continue enalapril 5 mg by mouth daily. Continue furosemide 40 mg by mouth as needed. Monitor blood pressure.    Anemia Check anemia panel. Monitor H&H.      DVT prophylaxis: Lovenox. Code Status: Full. Family Communication: Her two daughters are present in the room. Disposition Plan:  Consults called:  Admission status: Inpatient/MedSurg.   Bobette Mo MD [facility] Pager [phone].  If 7PM-7AM, please contact night-coverage [email or web address] Password [facility]  03/16/2016, 11:34 PM

## 2016-03-16 NOTE — ED Notes (Signed)
Lab at bedside floor.

## 2016-03-16 NOTE — Progress Notes (Signed)
Patient arrived to [address] [place] with daughters Judeth Cornfield and Rosey Bath (self identified as Power of Attorney) at the bedside. Rosey Bath gave RN paperwork enclosed in an envelope from vascular surgeon Dr. Durwin Nora from [facility] located in [place], [place]  where the patient was a resident before admittance to [facility]. Rosey Bath informed RN that she would like to be present at morning rounds considering she is the POA. RN informed Rosey Bath that she will inform day shift RN of the update and leave MD a note of the request. Nursing will continue to monitor.

## 2016-03-17 ENCOUNTER — Encounter (HOSPITAL_COMMUNITY): Payer: Self-pay | Admitting: General Practice

## 2016-03-17 ENCOUNTER — Inpatient Hospital Stay (HOSPITAL_COMMUNITY): Payer: Medicare Other

## 2016-03-17 DIAGNOSIS — I70262 Atherosclerosis of native arteries of extremities with gangrene, left leg: Secondary | ICD-10-CM

## 2016-03-17 DIAGNOSIS — Z9861 Coronary angioplasty status: Secondary | ICD-10-CM

## 2016-03-17 DIAGNOSIS — I739 Peripheral vascular disease, unspecified: Secondary | ICD-10-CM

## 2016-03-17 DIAGNOSIS — I2510 Atherosclerotic heart disease of native coronary artery without angina pectoris: Secondary | ICD-10-CM

## 2016-03-17 LAB — CBC WITH DIFFERENTIAL/PLATELET
Basophils Absolute: 0 10*3/uL (ref 0.0–0.1)
Basophils Relative: 0 %
Eosinophils Absolute: 0.1 10*3/uL (ref 0.0–0.7)
Eosinophils Relative: 1 %
HCT: 28.8 % — ABNORMAL LOW (ref 36.0–46.0)
Hemoglobin: 9.6 g/dL — ABNORMAL LOW (ref 12.0–15.0)
Lymphocytes Relative: 19 %
Lymphs Abs: 2.2 10*3/uL (ref 0.7–4.0)
MCH: 27 pg (ref 26.0–34.0)
MCHC: 33.3 g/dL (ref 30.0–36.0)
MCV: 81.1 fL (ref 78.0–100.0)
Monocytes Absolute: 1 10*3/uL (ref 0.1–1.0)
Monocytes Relative: 8 %
Neutro Abs: 8.6 10*3/uL — ABNORMAL HIGH (ref 1.7–7.7)
Neutrophils Relative %: 72 %
Platelets: 374 10*3/uL (ref 150–400)
RBC: 3.55 MIL/uL — ABNORMAL LOW (ref 3.87–5.11)
RDW: 15.6 % — ABNORMAL HIGH (ref 11.5–15.5)
WBC: 11.9 10*3/uL — ABNORMAL HIGH (ref 4.0–10.5)

## 2016-03-17 LAB — GLUCOSE, CAPILLARY
Glucose-Capillary: 99 mg/dL (ref 65–99)
Glucose-Capillary: 135 mg/dL — ABNORMAL HIGH (ref 65–99)
Glucose-Capillary: 132 mg/dL — ABNORMAL HIGH (ref 65–99)
Glucose-Capillary: 180 mg/dL — ABNORMAL HIGH (ref 65–99)

## 2016-03-17 LAB — COMPREHENSIVE METABOLIC PANEL
ALT: 26 U/L (ref 14–54)
AST: 22 U/L (ref 15–41)
Albumin: 2.5 g/dL — ABNORMAL LOW (ref 3.5–5.0)
Alkaline Phosphatase: 75 U/L (ref 38–126)
Anion gap: 11 (ref 5–15)
BUN: 15 mg/dL (ref 6–20)
CO2: 23 mmol/L (ref 22–32)
Calcium: 9.2 mg/dL (ref 8.9–10.3)
Chloride: 103 mmol/L (ref 101–111)
Creatinine, Ser: 0.71 mg/dL (ref 0.44–1.00)
GFR calc Af Amer: >60 mL/min (ref >60)
GFR calc non Af Amer: >60 mL/min (ref >60)
Glucose, Bld: 179 mg/dL — ABNORMAL HIGH (ref 65–99)
Potassium: 3.9 mmol/L (ref 3.5–5.1)
Sodium: 137 mmol/L (ref 135–145)
Total Bilirubin: 0.6 mg/dL (ref 0.3–1.2)
Total Protein: 7.4 g/dL (ref 6.5–8.1)

## 2016-03-17 LAB — MRSA PCR SCREENING: MRSA by PCR: NEGATIVE

## 2016-03-17 MED ORDER — GLUCERNA SHAKE PO LIQD
237.0000 mL | Freq: Three times a day (TID) | ORAL | Status: DC
  Administered 2016-03-17 – 2016-03-18 (×3): 237 mL via ORAL

## 2016-03-17 MED ORDER — ENSURE ENLIVE PO LIQD: 237.0000 mL | Freq: Two times a day (BID) | ORAL | Status: DC

## 2016-03-17 MED ORDER — FLEET ENEMA 7-19 GM/118ML RE ENEM
1.0000 | ENEMA | Freq: Once | RECTAL | Status: AC
  Administered 2016-03-17: 1 via RECTAL
  Filled 2016-03-17: qty 1

## 2016-03-17 MED ORDER — INSULIN GLARGINE 100 UNIT/ML ~~LOC~~ SOLN
5.0000 [IU] | Freq: Every day | SUBCUTANEOUS | Status: DC
  Administered 2016-03-17: 5 [IU] via SUBCUTANEOUS
  Filled 2016-03-17 (×2): qty 0.05

## 2016-03-17 NOTE — Progress Notes (Signed)
Inpatient Diabetes Program Recommendations  AACE/ADA: New Consensus Statement on Inpatient Glycemic Control (2015)  Target Ranges:  Prepandial:   less than 140 mg/dL      Peak postprandial:   less than 180 mg/dL (1-2 hours)      Critically ill patients:  140 - 180 mg/dL  Results for [name] [name], [name] [name] (MRN [id number]) as of 03/17/2016 11:18  Ref. Range 03/16/2016 22:43 03/17/2016 06:50  Glucose-Capillary Latest Ref Range: 65-99 mg/dL 450 (H) 99   Review of Glycemic Control  Outpatient Diabetes medications: Lantus 5 units QHS, Humalog 4-16 units TID with meals Current orders for Inpatient glycemic control: Lantus 5 units QHS, Novolog 0-9 units TID with meals  Inpatient Diabetes Program Recommendations: HgbA1C: A1C in process.  NOTE: Noted consult for diabetes coordinator per Diabetic Foot Ulcer protocol. Agree with current inpatient glycemic control orders. Will continue to follow and make recommendations if needed as more data is collected.  Thanks, Orlando Penner, RN, MSN, CDE Diabetes Coordinator Inpatient Diabetes Program [phone] (Team Pager from 8am to 5pm) [phone] ([facility] office) [phone] [facility] office) [phone] [facility] office)

## 2016-03-17 NOTE — Progress Notes (Signed)
Initial Nutrition Assessment  DOCUMENTATION CODES:   Severe malnutrition in context of chronic illness  INTERVENTION:  -Glucerna Shake po TID, each supplement provides 220 kcal and 10 grams of protein -Recommend continue facility regimen of 400mg  Megace daily -RD continue to monitor  NUTRITION DIAGNOSIS:   Malnutrition related to chronic illness as evidenced by severe depletion of body fat, severe depletion of muscle mass.  GOAL:   Patient will meet greater than or equal to 90% of their needs  MONITOR:   PO intake, Supplement acceptance, Labs, I & O's, Weight trends  REASON FOR ASSESSMENT:   Consult Wound healing  ASSESSMENT:   [name] [name] is a 82 y.o. female with a past medical history of type 2 diabetes, hypertension, rheumatoid arthritis, peripheral vascular disease, anemia, irregular heartbeat, CAD,S/P MI in February this year, CVA was brought from [facility] facility due to having left foot pain.  Spoke with [name] [name]'s [name] [name] and [name] [name] at bedside.  [name] [name] states that since pt's MI In February this year, PO intake has decreased dramatically. During that time span, reported 26# wt loss. Per chart pt was 123# as of 12/16; Pt reported wt of 120# during visit.  Per [name] [name], pt has been on Megace at her facility for the past 1.5 weeks, during which time her appetite has improved dramatically. I asked patient if she was hungry and she said "not really."  Continuing Megace would be best course of action to maintain appetite during inpatient stay.  Patient appears severely malnourished. Severe muscle wasting and severe fat depletion, no edema.  Diabetic foot wound appears black, necrotic.  Speaking with [name] [name], she stated they are open to any interventions food wise. Glucerna TID will be a good start, if patient is consuming 100% she should be ok. Explained to [name] [name] that if pt does not want to eat, that we may have to accept the ramifications of  her circumstances in relation to age, dementia, and recent MI. [name] [name] was receptive to this.  Labs: CBG 135-176 Medications reviewed.  Diet Order:  Diet heart healthy/carb modified Room service appropriate?: Yes; Fluid consistency:: Thin  Skin:  Reviewed, no issues  Last BM:  PTA  Height:   Ht Readings from Last 1 Encounters:  03/17/16 5\' 9"  (1.753 m)    Weight:   Wt Readings from Last 1 Encounters:  03/17/16 120 lb (54.432 kg)    Ideal Body Weight:  65.9 kg  BMI:  Body mass index is 17.71 kg/(m^2).  Estimated Nutritional Needs:   Kcal:  1350-1650  Protein:  55-65 grams  Fluid:  >/= 1.3L  EDUCATION NEEDS:   No education needs identified at this time  . Jarrad Mclees, MS, RD LDN After Hours/Weekend Pager [phone]

## 2016-03-17 NOTE — Progress Notes (Addendum)
   Daily Progress Note  Assessment/Planning: LLE 1st toe dry gangrene   No revascularization options per Dr. Adele Dan angiogram  At this point, the patient is asx, so no intervention immediately necessary  At this point, patient is leaning toward non-operative management, i.e. Wound care, abx, and pain rx prn  In the event, pt develops rest pain or worsening wounds, she can contact our office directly to arrange a palliative L AKA.  Given the recent MI, she would be at increased risk of dying even with L AKA  Subjective    No complaints  Objective Filed Vitals:   03/17/16 0620 03/17/16 1052 03/17/16 1329 03/17/16 1546  BP: 122/73 116/58 100/45   Pulse: 96 93 94   Temp: 98.2 F (36.8 C)  98.2 F (36.8 C)   TempSrc: Oral     Resp: 14  16   Height:    5\' 9"  (1.753 m)  Weight:    120 lb (54.432 kg)  SpO2: 100%  100%     Intake/Output Summary (Last 24 hours) at 03/17/16 1805 Last data filed at 03/17/16 1424  Gross per 24 hour  Intake    290 ml  Output      0 ml  Net    290 ml    VASC  R great toe dry gangrene, no ascending cellulitis  Laboratory CBC    Component Value Date/Time   WBC 11.9* 03/17/2016 0059   HGB 9.6* 03/17/2016 0059   HCT 28.8* 03/17/2016 0059   PLT 374 03/17/2016 0059    BMET    Component Value Date/Time   NA 137 03/17/2016 0059   K 3.9 03/17/2016 0059   CL 103 03/17/2016 0059   CO2 23 03/17/2016 0059   GLUCOSE 179* 03/17/2016 0059   BUN 15 03/17/2016 0059   CREATININE 0.71 03/17/2016 0059   CALCIUM 9.2 03/17/2016 0059   GFRNONAA >60 03/17/2016 0059   GFRAA >60 03/17/2016 0059    05/17/2016, MD [facility] Office: [phone] Pager: [phone]  03/17/2016, 6:05 PM

## 2016-03-17 NOTE — Progress Notes (Signed)
VASCULAR LAB PRELIMINARY  ARTERIAL  Results from duplex study done 10-21-15 at [facility]:     ABI completed:    RIGHT    LEFT    PRESSURE WAVEFORM  PRESSURE WAVEFORM  BRACHIAL 112 Triphasic  BRACHIAL 126 Triphasic   DP 115 Triphasic  DP absent   AT   AT    PT absent  PT absent   PER 122 Triphasic  PER absent   GREAT TOE  NA GREAT TOE  NA    RIGHT LEFT  ABI 0.97 no change since 10-21-15 Absent 0.43 on 10-21-15      Nykole Matos, RVT 03/17/2016, 10:25 AM

## 2016-03-17 NOTE — Progress Notes (Signed)
PROGRESS NOTE    [name] [name]  MHD:[id number] DOB: Sep 09, 1928 DOA: 03/16/2016 PCP: Joaquin Courts, DO   Outpatient Specialists:    Brief Narrative: 82 y.o. female with a past medical history of type 2 diabetes, hypertension, rheumatoid arthritis, peripheral vascular disease, anemia, irregular heartbeat, CAD,S/P MI in February this year, CVA was brought from [facility] facility due to having left foot pain. Per patient's daughters, about a month ago the patient had a small lesion on her left toe, which has worsened significantly in the past 2 weeks to the point that has become very tender and his color has changed to black. Per patient's daughters, she sees her vascular surgeon, Dr. Edilia Bo, who was stated on the last visit that she has very poor circulation of the left lower extremity.   Assessment & Plan:   Principal Problem:  Left great toe gangrene, looks dry,  Peripheral vascular disease, unspecified (HCC)   Consulted Dr. Durwin Nora. Likely DC in a.m. If no further work up. Palliative care consult to define goal of care and end of life issues.  Active Problems:  Diabetes mellitus, type 2 (HCC) Carbohydrate modified diet. CBG monitoring with regular insulin sliding scale.   CAD S/P LAD DES 12/16/15-[place]  Cardiac arrest- post PCI 12/16/15 Continue aspirin and ACE inhibitor.   PSVT  Check Digoxin level.   Essential hypertension - Optimize. Monitor blood pressure.   Anemia Check anemia panel. Monitor H&H.   GUARDED PROGNOSIS. COMMUNICATED GUARDED PROGNOSIS TO PATIENT'S DAUGHTER, WHO HAPPENS TO BE PATIENT'S POA. CODE STATUS DISCUSSED. WILL CONSULT PALLIATIVE CARE TEAM.   DVT prophylaxis: Lovenox. Code Status: Full. Family Communication: Her two daughters are present in the room. Disposition Plan:  Consults called:   Subjective: Seen alongside patient's 2 daughters. Most of the history came from patient's daughter. Patient has vascular dementia.  Resting quietly. Gangrene looks dry, but will appreciate Vascular Surgery team opinion and input. Dr. Cristal Deer Dickson's office called and consulted.  Objective: Filed Vitals:   03/17/16 0620 03/17/16 1052 03/17/16 1329 03/17/16 1546  BP: 122/73 116/58 100/45   Pulse: 96 93 94   Temp: 98.2 F (36.8 C)  98.2 F (36.8 C)   TempSrc: Oral     Resp: 14  16   Height:    5\' 9"  (1.753 m)  Weight:    54.432 kg (120 lb)  SpO2: 100%  100%     Intake/Output Summary (Last 24 hours) at 03/17/16 1655 Last data filed at 03/17/16 1424  Gross per 24 hour  Intake    290 ml  Output      0 ml  Net    290 ml   Filed Weights   03/17/16 1546  Weight: 54.432 kg (120 lb)    Examination:  General exam: Appears calm and comfortable  Respiratory system: Clear to auscultation.   Cardiovascular system: S1 & S2  Gastrointestinal system: Abdomen is nondistended, soft and nontender.   Central nervous system: Awake.  Extremities: Left great toe gangrene, ??dry.  Data Reviewed: I have personally reviewed following labs and imaging studies  CBC:  Recent Labs Lab 03/16/16 1724 03/17/16 0059  WBC 10.8* 11.9*  NEUTROABS  --  8.6*  HGB 9.8* 9.6*  HCT 28.8* 28.8*  MCV 80.9 81.1  PLT 401* 374   Basic Metabolic Panel:  Recent Labs Lab 03/16/16 1724 03/16/16 2045 03/17/16 0059  NA 137  --  137  K 4.1  --  3.9  CL 104  --  103  CO2  21*  --  23  GLUCOSE 176*  --  179*  BUN 17  --  15  CREATININE 0.80  --  0.71  CALCIUM 9.3  --  9.2  MG  --  2.2  --   PHOS  --  3.1  --    GFR: Estimated Creatinine Clearance: 43.4 mL/min (by C-G formula based on Cr of 0.71). Liver Function Tests:  Recent Labs Lab 03/16/16 1724 03/17/16 0059  AST 26 22  ALT 27 26  ALKPHOS 80 75  BILITOT 0.4 0.6  PROT 7.5 7.4  ALBUMIN 2.7* 2.5*   No results for input(s): LIPASE, AMYLASE in the last 168 hours. No results for input(s): AMMONIA in the last 168 hours. Coagulation Profile: No results for input(s):  INR, PROTIME in the last 168 hours. Cardiac Enzymes: No results for input(s): CKTOTAL, CKMB, CKMBINDEX, TROPONINI in the last 168 hours. BNP (last 3 results) No results for input(s): PROBNP in the last 8760 hours. HbA1C: No results for input(s): HGBA1C in the last 72 hours. CBG:  Recent Labs Lab 03/16/16 2243 03/17/16 0650 03/17/16 1206 03/17/16 1612  GLUCAP 137* 99 135* 132*   Lipid Profile: No results for input(s): CHOL, HDL, LDLCALC, TRIG, CHOLHDL, LDLDIRECT in the last 72 hours. Thyroid Function Tests: No results for input(s): TSH, T4TOTAL, FREET4, T3FREE, THYROIDAB in the last 72 hours. Anemia Panel: No results for input(s): VITAMINB12, FOLATE, FERRITIN, TIBC, IRON, RETICCTPCT in the last 72 hours. Urine analysis:    Component Value Date/Time   COLORURINE YELLOW 02/24/2012 1857   APPEARANCEUR CLEAR 02/24/2012 1857   LABSPEC 1.008 02/24/2012 1857   PHURINE 6.0 02/24/2012 1857   GLUCOSEU 100* 02/24/2012 1857   HGBUR NEGATIVE 02/24/2012 1857   BILIRUBINUR NEGATIVE 02/24/2012 1857   KETONESUR NEGATIVE 02/24/2012 1857   PROTEINUR NEGATIVE 02/24/2012 1857   UROBILINOGEN 0.2 02/24/2012 1857   NITRITE NEGATIVE 02/24/2012 1857   LEUKOCYTESUR SMALL* 02/24/2012 1857   Sepsis Labs: @LABRCNTIP (procalcitonin:4,lacticidven:4)  ) Recent Results (from the past 240 hour(s))  Culture, blood (Routine X 2) w Reflex to ID Panel     Status: None (Preliminary result)   Collection Time: 03/16/16  7:27 PM  Result Value Ref Range Status   Specimen Description BLOOD LEFT ANTECUBITAL  Final   Special Requests BOTTLES DRAWN AEROBIC AND ANAEROBIC 5CC  Final   Culture NO GROWTH < 24 HOURS  Final   Report Status PENDING  Incomplete  Culture, blood (Routine X 2) w Reflex to ID Panel     Status: None (Preliminary result)   Collection Time: 03/16/16  8:40 PM  Result Value Ref Range Status   Specimen Description BLOOD LEFT ANTECUBITAL  Final   Special Requests IN PEDIATRIC BOTTLE 2CC  Final    Culture NO GROWTH < 24 HOURS  Final   Report Status PENDING  Incomplete         Radiology Studies: Dg Foot Complete Left  03/16/2016  CLINICAL DATA:  Left foot pain.  Discolored toes. EXAM: LEFT FOOT - COMPLETE 3+ VIEW COMPARISON:  None. FINDINGS: Negative for acute fracture, dislocation or radiopaque foreign body. There is no bone lesion or bony destruction. No soft tissue gas. IMPRESSION: Negative. Electronically Signed   By: 05/16/2016 M.D.   On: 03/16/2016 19:05        Scheduled Meds: . aspirin  81 mg Oral Daily  . aztreonam  1 g Intravenous Q8H  . digoxin  250 mcg Oral Daily  . enalapril  5 mg Oral Daily  .  enoxaparin (LOVENOX) injection  40 mg Subcutaneous QHS  . feeding supplement (GLUCERNA SHAKE)  237 mL Oral TID BM  . insulin aspart  0-9 Units Subcutaneous TID WC  . insulin glargine  5 Units Subcutaneous QHS  . metroNIDAZOLE  500 mg Oral Q8H  . vancomycin  750 mg Intravenous Q24H   Continuous Infusions:    LOS: 1 day    Time spent: 12 Minutes    Barnetta Chapel, MD [facility] Pager [phone]  If 7PM-7AM, please contact night-coverage [email or web address] Password [email or web address] 03/17/2016, 4:55 PM

## 2016-03-17 NOTE — Consult Note (Signed)
WOC reviewed chart, patient with symptomatic left foot pain with gangrenous black toe, eschar and erythema. Odor per ED provider.  I have not assessed patients foot due to recent ABI obtained this am.   RIGHT    LEFT    PRESSURE WAVEFORM  PRESSURE WAVEFORM  BRACHIAL 112 Triphasic  BRACHIAL 126 Triphasic   DP 115 Triphasic  DP absent   AT   AT    PT absent  PT absent   PER 122 Triphasic  PER absent   GREAT TOE  NA GREAT TOE  NA    RIGHT LEFT  ABI 0.97 no change since 10-21-15 Absent 0.43 on 10-21-15       Will need vascular evaluation, pending.   Re consult if needed, will not follow at this time. Thanks  [name] [name] [employer], CWOCN [phone])

## 2016-03-17 NOTE — Progress Notes (Signed)
Nutrition Brief Note  MD with pt during time of visit. Pt does not have current height and weight needed for assessment. Will attempt to see later today or complete consult tomorrow.  Dionne Ano. Anie Juniel, MS, RD LDN After Hours/Weekend Pager [phone]

## 2016-03-18 ENCOUNTER — Encounter: Payer: Self-pay | Admitting: Vascular Surgery

## 2016-03-18 LAB — CBC WITH DIFFERENTIAL/PLATELET
Basophils Absolute: 0 10*3/uL (ref 0.0–0.1)
Basophils Relative: 0 %
Eosinophils Absolute: 0 10*3/uL (ref 0.0–0.7)
Eosinophils Relative: 0 %
HCT: 29.1 % — ABNORMAL LOW (ref 36.0–46.0)
Hemoglobin: 10 g/dL — ABNORMAL LOW (ref 12.0–15.0)
Lymphocytes Relative: 13 %
Lymphs Abs: 1.6 10*3/uL (ref 0.7–4.0)
MCH: 28.1 pg (ref 26.0–34.0)
MCHC: 34.4 g/dL (ref 30.0–36.0)
MCV: 81.7 fL (ref 78.0–100.0)
Monocytes Absolute: 0.8 10*3/uL (ref 0.1–1.0)
Monocytes Relative: 6 %
Neutro Abs: 9.5 10*3/uL — ABNORMAL HIGH (ref 1.7–7.7)
Neutrophils Relative %: 81 %
Platelets: 399 10*3/uL (ref 150–400)
RBC: 3.56 MIL/uL — ABNORMAL LOW (ref 3.87–5.11)
RDW: 15.6 % — ABNORMAL HIGH (ref 11.5–15.5)
WBC: 11.9 10*3/uL — ABNORMAL HIGH (ref 4.0–10.5)

## 2016-03-18 LAB — URINALYSIS, ROUTINE W REFLEX MICROSCOPIC
Bilirubin Urine: NEGATIVE
Glucose, UA: 250 mg/dL — AB
Hgb urine dipstick: NEGATIVE
KETONES UR: NEGATIVE mg/dL
NITRITE: POSITIVE — AB
PROTEIN: NEGATIVE mg/dL
Specific Gravity, Urine: 1.019 (ref 1.005–1.030)
pH: 6.5 (ref 5.0–8.0)

## 2016-03-18 LAB — COMPREHENSIVE METABOLIC PANEL
ALT: 22 U/L (ref 14–54)
AST: 18 U/L (ref 15–41)
Albumin: 2.4 g/dL — ABNORMAL LOW (ref 3.5–5.0)
Alkaline Phosphatase: 72 U/L (ref 38–126)
Anion gap: 16 — ABNORMAL HIGH (ref 5–15)
BUN: 15 mg/dL (ref 6–20)
CO2: 20 mmol/L — ABNORMAL LOW (ref 22–32)
Calcium: 9.2 mg/dL (ref 8.9–10.3)
Chloride: 100 mmol/L — ABNORMAL LOW (ref 101–111)
Creatinine, Ser: 0.67 mg/dL (ref 0.44–1.00)
GFR calc Af Amer: >60 mL/min (ref >60)
GFR calc non Af Amer: >60 mL/min (ref >60)
Glucose, Bld: 164 mg/dL — ABNORMAL HIGH (ref 65–99)
Potassium: 3.7 mmol/L (ref 3.5–5.1)
Sodium: 136 mmol/L (ref 135–145)
Total Bilirubin: 0.4 mg/dL (ref 0.3–1.2)
Total Protein: 7.1 g/dL (ref 6.5–8.1)

## 2016-03-18 LAB — URINE MICROSCOPIC-ADD ON

## 2016-03-18 LAB — HEMOGLOBIN A1C
HEMOGLOBIN A1C: 6.5 % — AB (ref 4.8–5.6)
MEAN PLASMA GLUCOSE: 140 mg/dL

## 2016-03-18 LAB — GLUCOSE, CAPILLARY
GLUCOSE-CAPILLARY: 170 mg/dL — AB (ref 65–99)
GLUCOSE-CAPILLARY: 97 mg/dL (ref 65–99)

## 2016-03-18 NOTE — NC FL2 (Signed)
[place] MEDICAID FL2 LEVEL OF CARE SCREENING TOOL     IDENTIFICATION  Patient Name: [name] [name] Birthdate: 1928-02-12 Sex: female Admission Date (Current Location): 03/16/2016  [facility] and [place] Number:  Producer, television/film/video and Address:  [facility], [address] [place], [place] [address]      Provider Number: [id number]  Attending Physician Name and Address:  Barnetta Chapel, MD  Relative Name and Phone Number:       Current Level of Care: Hospital Recommended Level of Care: Skilled Nursing Facility Prior Approval Number:    Date Approved/Denied:   PASRR Number:  (N/a, [facility] resident)  Discharge Plan: SNF    Current Diagnoses: Patient Active Problem List   Diagnosis Date Noted  . Diabetic foot infection (HCC) 03/16/2016  . Essential hypertension 03/16/2016  . Anemia 03/16/2016  . Diabetes mellitus, type 2 (HCC) 03/16/2016  . Chest pain with high risk of acute coronary syndrome 12/23/2015  . Cardiac arrest- post PCI 12/16/15 12/23/2015  . PSVT  12/23/2015  . CAD S/P LAD DES 12/16/15-[place] 11/02/2015  . Peripheral vascular disease, unspecified (HCC) 04/18/2012    Orientation RESPIRATION BLADDER Height & Weight        Normal Incontinent Weight: 120 lb (54.432 kg) Height:  5\' 9"  (175.3 cm)  BEHAVIORAL SYMPTOMS/MOOD NEUROLOGICAL BOWEL NUTRITION STATUS      Continent Diet (Please see discharge summary.)  AMBULATORY STATUS COMMUNICATION OF NEEDS Skin   Total Care Verbally Other (Comment) (Diabetic ulcer on toe)                       Personal Care Assistance Level of Assistance  Bathing, Feeding, Dressing Bathing Assistance: Maximum assistance Feeding assistance: Maximum assistance Dressing Assistance: Maximum assistance     Functional Limitations Info             SPECIAL CARE FACTORS FREQUENCY                       Contractures      Additional Factors Info  Allergies, Code Status Code Status Info:  FULL Allergies Info: Ciprofloxacin, Erythromycin, Macrolides And Ketolides, Nitrofurantoin, Penicillins, Sulfa Antibiotics           Current Medications (03/18/2016):  This is the current hospital active medication list Current Facility-Administered Medications  Medication Dose Route Frequency Provider Last Rate Last Dose  . acetaminophen (TYLENOL) tablet 650 mg  650 mg Oral Q6H PRN 05/18/2016, MD   650 mg at 03/17/16 2302   Or  . acetaminophen (TYLENOL) suppository 650 mg  650 mg Rectal Q6H PRN 2303, MD      . aspirin chewable tablet 81 mg  81 mg Oral Daily Bobette Mo, MD   81 mg at 03/18/16 1007  . aztreonam (AZACTAM) 1 g in dextrose 5 % 50 mL IVPB  1 g Intravenous Q8H 05/18/16, MD   1 g at 03/18/16 0547  . digoxin (LANOXIN) tablet 250 mcg  250 mcg Oral Daily 05/18/16, MD   250 mcg at 03/18/16 1008  . enalapril (VASOTEC) tablet 5 mg  5 mg Oral Daily 05/18/16, MD   5 mg at 03/18/16 1008  . enoxaparin (LOVENOX) injection 40 mg  40 mg Subcutaneous QHS 05/18/16, MD   40 mg at 03/17/16 2300  . feeding supplement (GLUCERNA SHAKE) (GLUCERNA SHAKE) liquid 237 mL  237 mL Oral TID BM  Barnetta Chapel, MD   237 mL at 03/18/16 1000  . furosemide (LASIX) tablet 40 mg  40 mg Oral Daily PRN Bobette Mo, MD      . insulin aspart (novoLOG) injection 0-9 Units  0-9 Units Subcutaneous TID WC Bobette Mo, MD   2 Units at 03/18/16 [phone]  . insulin glargine (LANTUS) injection 5 Units  5 Units Subcutaneous QHS Marquita Palms, [facility]   5 Units at 03/17/16 2301  . metroNIDAZOLE (FLAGYL) tablet 500 mg  500 mg Oral Q8H Bobette Mo, MD   500 mg at 03/18/16 0547  . ondansetron (ZOFRAN) tablet 4 mg  4 mg Oral Q6H PRN Bobette Mo, MD       Or  . ondansetron [facility]) injection 4 mg  4 mg Intravenous Q6H PRN Bobette Mo, MD      . vancomycin [facility]) IVPB 750 mg/150 ml premix  750 mg Intravenous Q24H Bobette Mo,  MD   750 mg at 03/17/16 1718     Discharge Medications: Please see discharge summary for a list of discharge medications.  Relevant Imaging Results:  Relevant Lab Results:   Additional Information SSN: [id number]  Rod Mae, LCSW

## 2016-03-18 NOTE — Clinical Social Work Note (Signed)
Clinical Social Worker facilitated patient discharge including contacting patient family and facility to confirm patient discharge plans.  Clinical information faxed to facility and family agreeable with plan.  CSW arranged transport with patient family to [facility].  RN to call report prior to discharge.  Clinical Social Worker will sign off for now as social work intervention is no longer needed. Please consult [place] again if new need arises.  Macario Golds, [place] [phone]

## 2016-03-18 NOTE — Clinical Social Work Note (Signed)
Patient from [facility] in [place], [place]. Phone: [phone]  Per facility, patient able to return once medically stable. CSW to continue to follow and assist with discharge planning needs.  Marcelline Deist, LCSW [phone] Orthopedics: [phone] Surgical: [phone]

## 2016-03-18 NOTE — Clinical Documentation Improvement (Signed)
Internal Medicine  Nutritional evaluation done 03/17/16.  Please review and document diagnosis if appropriate for this admission.  Thank you     Document Severity - Severe(third degree), Moderate (second degree), Mild (first degree)  Form - [place] (rarely seen in the [place], Other Condition, Unable to Determine  Other condition  Unable to clinically determine  Document any associated diagnoses/conditions   Supporting Information: BMI 17.71 kg / DM w gangrene to Rt great toe/ Vascular Dementia   Nutritional evaluation: "severe malnutrition in context of chronic illness"    Please exercise your independent, professional judgment when responding. A specific answer is not anticipated or expected.   Thank You, Lavonda Jumbo [employer] [facility] [place] [phone]

## 2016-03-18 NOTE — Progress Notes (Signed)
Patient c/o abdominal pain, trying to have a BM with no resullt.  RN disimpacted pt --large amount of stool.  Patient has been incontinent of urine.  Bladder scan after patient urinated in bed pad, 368cc .  In and out cath.  400 cc of tea colored malodorous urine.  Urine sent for urinalysis.

## 2016-03-18 NOTE — Discharge Summary (Signed)
Physician Discharge Summary  Patient ID: [name] [name] MRN: [id number] DOB/AGE: 05/03/1928 82 y.o.  Admit date: 03/16/2016 Discharge date: 03/18/2016  Admission Diagnoses:  Discharge Diagnoses:  Principal Problem:   Diabetic foot infection [facility]) Active Problems:   Peripheral vascular disease, unspecified (HCC)   CAD S/P LAD DES 12/16/15-[place]   Cardiac arrest- post PCI 12/16/15   PSVT    Essential hypertension   Anemia   Diabetes mellitus, type 2 (HCC)   Discharged Condition: stable  Hospital Course:  Per patient's daughters, about a month ago the patient had a small lesion on her left toe, which has worsened significantly in the past 2 weeks to the point that has become very tender and his color has changed to black. Per patient's daughters, she sees her vascular surgeon, Dr. Edilia Bo, who was stated on the last visit that she has very poor circulation of the left lower extremity.   82 y.o. female with a past medical history of type 2 diabetes, hypertension, rheumatoid arthritis, peripheral vascular disease, anemia, irregular heartbeat, CAD,S/P MI in February this year, CVA brought in from [facility] with reports of worsening gangrene to left big toe. According to the patient's daughters, about a month ago the patient had a small lesion on her left toe, but has worsened significantly in the past 2 weeks, and the color has changed to black. Patient is known to Vascular Surgeon, Dr. Edilia Bo. Apparently, Dr. Edilia Bo worked patient's left lower extremity PVD up on last admission and no further vascular intervention was deemed feasible.  Patient was admitted for further assessment and management. Patient is noted to have dry gangrene of the left big toe. Vascular surgery team was consulted to assist in directing patient's care. The Vascular Surgery has advised that gangrene was dry, and no immediate treatment was needed. Patient was also deemed a poor Surgical Candidate.  Option  of palliative care input was broached with the patient's family.  Consults: Vascular Surgery  Significant Diagnostic Studies: Vascular studies.  Discharge Medication - see the Med. Rec.  Discharge Exam: Blood pressure 124/64, pulse 100, temperature 99 F (37.2 C), temperature source Oral, resp. rate 16, height 5\' 9"  (1.753 m), weight 54.432 kg (120 lb), SpO2 99 %.    Disposition: 03-Skilled Nursing Facility  Discharge Instructions    Bed rest    Complete by:  As directed      Call MD for:    Complete by:  As directed   Call Physician with worsening symptoms     Diet general    Complete by:  As directed   Resume previous diet as per SNF     Discharge instructions    Complete by:  As directed   Call PCP for worsening symptoms. Follow up with PCP and Vascular Surgeon            Medication List    TAKE these medications        acetaminophen 325 MG tablet  Commonly known as:  TYLENOL  Take 650 mg by mouth every 4 (four) hours.     ARGINAID Pack  Take 1 Package by mouth daily.     aspirin 325 MG tablet  Take 325 mg by mouth daily.     carvedilol 3.125 MG tablet  Commonly known as:  COREG  Take 3.125 mg by mouth 2 (two) times daily with a meal.     clopidogrel 75 MG tablet  Commonly known as:  PLAVIX  Take 75 mg by mouth daily.  doxycycline 100 MG tablet  Commonly known as:  ADOXA  Take 100 mg by mouth 2 (two) times daily.     famotidine 20 MG tablet  Commonly known as:  PEPCID  Take 20 mg by mouth 2 (two) times daily.     furosemide 40 MG tablet  Commonly known as:  LASIX  Take 40 mg by mouth daily. For swelling     insulin glargine 100 UNIT/ML injection  Commonly known as:  LANTUS  Inject 5 Units into the skin at bedtime.     insulin lispro 100 UNIT/ML injection  Commonly known as:  HUMALOG  Inject 4-16 Units into the skin 3 (three) times daily before meals. Sliding scale: 151-200 = 4 units. 201-250 = 8 units, 251-300 = 10 units. 301-350 = 12 units.  351-400 = 16 units. Greater than 400 call MD.     megestrol 40 MG/ML suspension  Commonly known as:  MEGACE  Take 400 mg by mouth daily.     mirtazapine 7.5 MG tablet  Commonly known as:  REMERON  Take 7.5 mg by mouth at bedtime.     multivitamins ther. w/minerals Tabs tablet  Take 1 tablet by mouth 2 (two) times daily.     OVER THE COUNTER MEDICATION  Take 2 scoop by mouth 2 (two) times daily. Protein Powder Health Shake     topiramate 25 MG tablet  Commonly known as:  TOPAMAX  Take 25 mg by mouth at bedtime.           Follow-up Information    Follow up with FAVERO,JOHN PATRICK, DO In 2 days.   Specialty:  Family Medicine   Contact information:   [address] [place] [address] [phone]       Signed: Barnetta Chapel 03/18/2016, 12:19 PM

## 2016-03-21 LAB — CULTURE, BLOOD (ROUTINE X 2)
CULTURE: NO GROWTH
CULTURE: NO GROWTH

## 2016-03-25 ENCOUNTER — Ambulatory Visit: Payer: Medicare Other | Admitting: Vascular Surgery

## 2017-01-09 IMAGING — CR DG FOOT COMPLETE 3+V*L*
3 series · 3 of 3 positions shown · non-contrast
Comparison: None.

CLINICAL DATA: Left foot pain.  Discolored toes.

EXAM:
LEFT FOOT - COMPLETE 3+ VIEW

[foot ap]
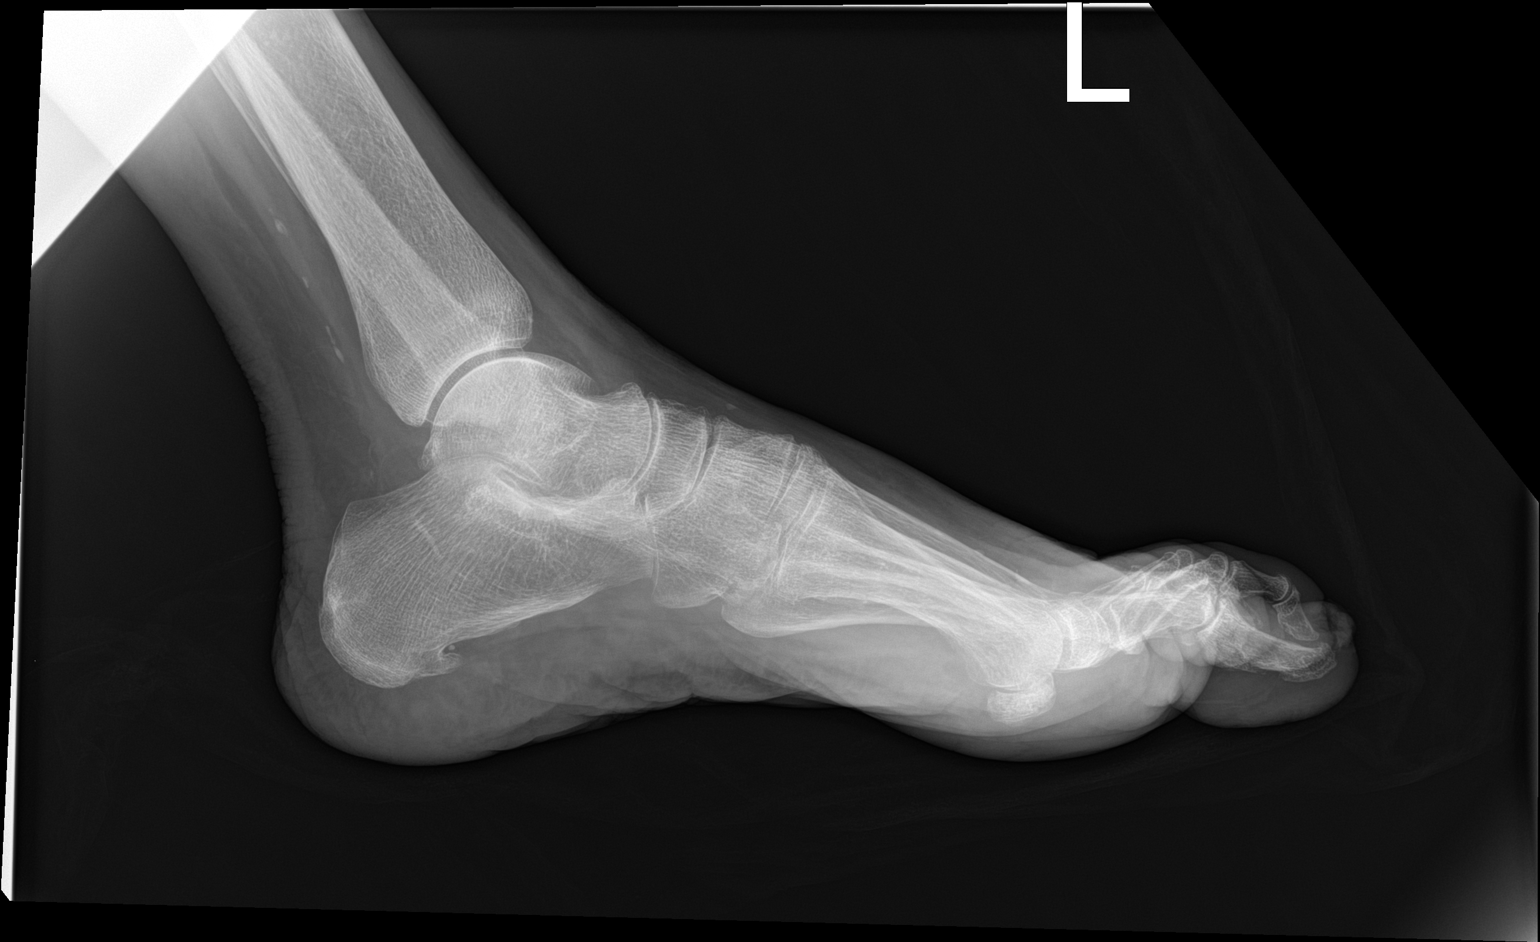

[foot obl]
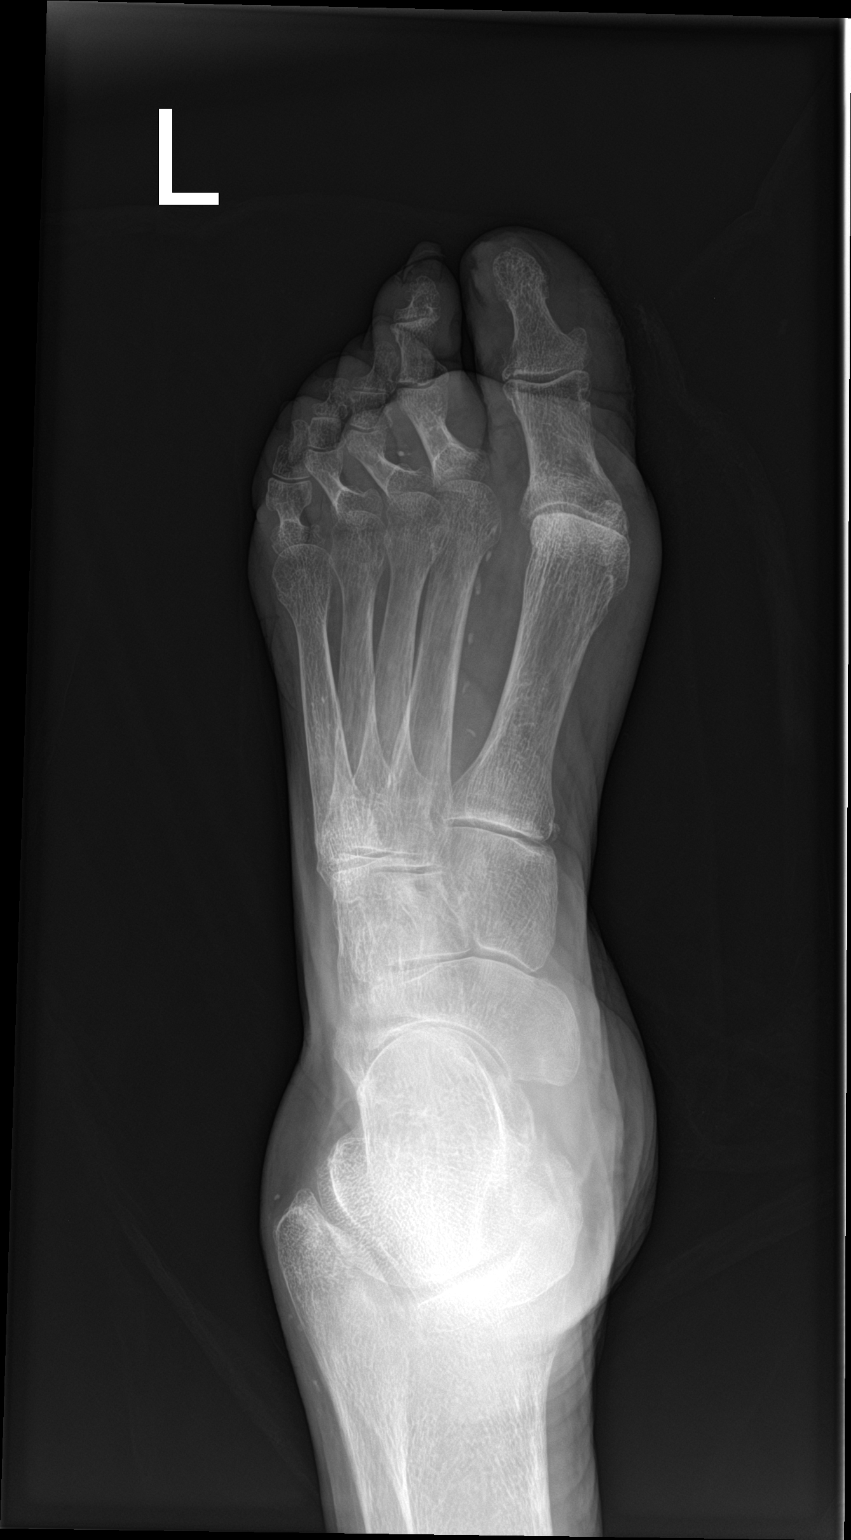

[foot lat]
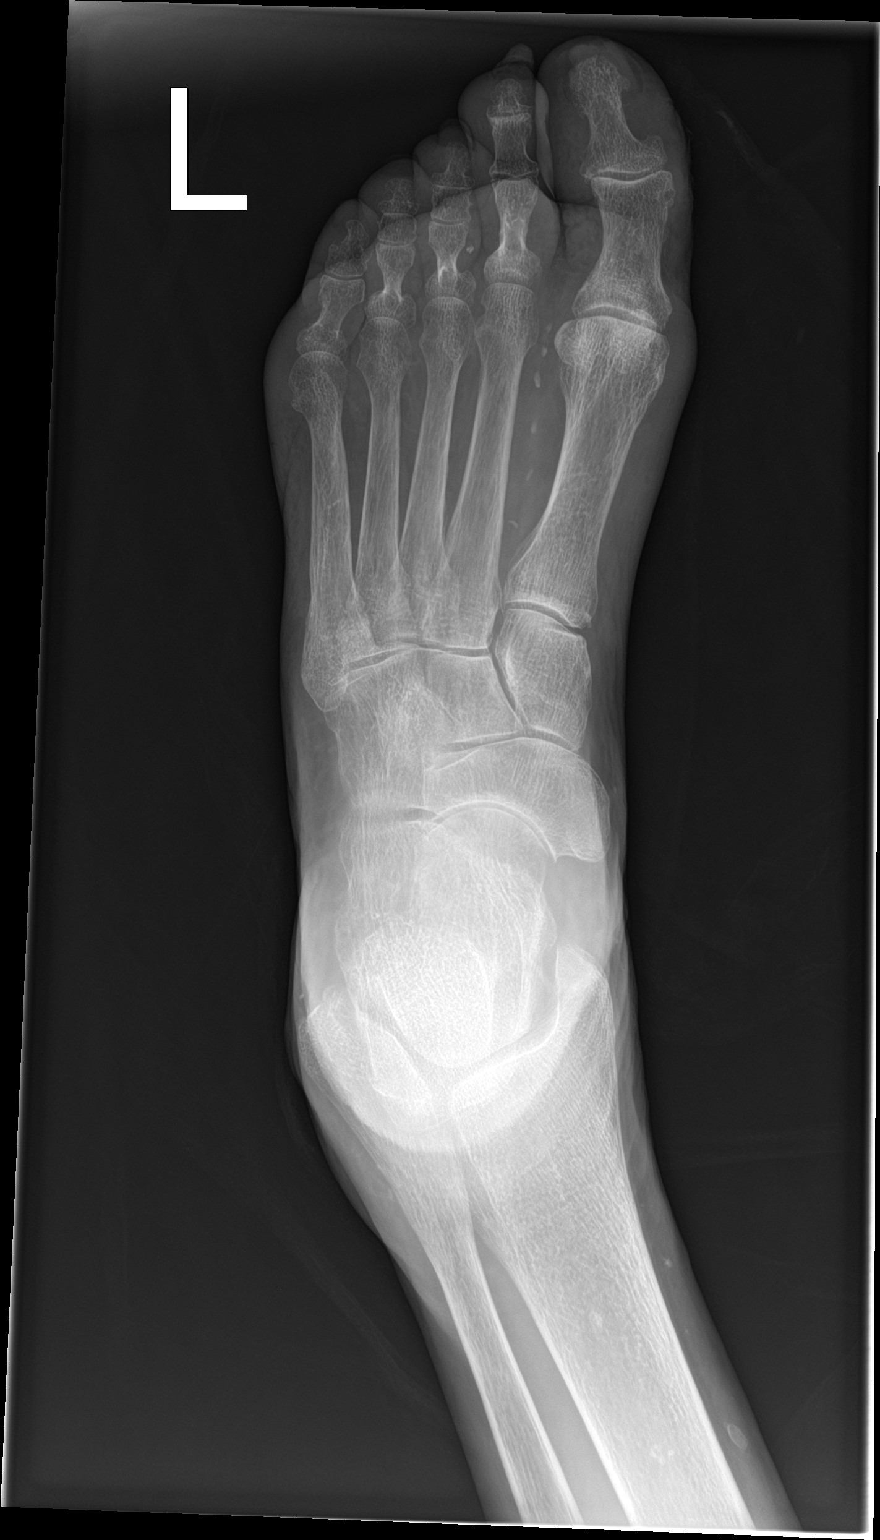

[3 of 3 positions shown; findings below may reference images not displayed]

FINDINGS: Negative for acute fracture, dislocation or radiopaque foreign body.
There is no bone lesion or bony destruction. No soft tissue gas.
IMPRESSION: Negative.
